# Patient Record
Sex: Male | Born: 1995 | Race: White | Hispanic: No | Marital: Single | Smoking: Current every day smoker
Health system: Southern US, Community
[De-identification: ages and names within clinical notes are randomized; demographics above are authoritative.]

## PROBLEM LIST (undated history)

## (undated) DIAGNOSIS — F319 Bipolar disorder, unspecified: Secondary | ICD-10-CM

## (undated) HISTORY — DX: Bipolar disorder, unspecified: F31.9

---
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
## 2014-07-06 ENCOUNTER — Encounter: Payer: Self-pay | Admitting: Physician Assistant

## 2014-07-06 ENCOUNTER — Ambulatory Visit (INDEPENDENT_AMBULATORY_CARE_PROVIDER_SITE_OTHER): Payer: BLUE CROSS/BLUE SHIELD | Admitting: Physician Assistant

## 2014-07-06 VITALS — BP 100/58 | HR 66 | Temp 97.7°F | Resp 16 | Ht 75.5 in | Wt 163.2 lb

## 2014-07-06 DIAGNOSIS — F1210 Cannabis abuse, uncomplicated: Secondary | ICD-10-CM

## 2014-07-06 DIAGNOSIS — F3160 Bipolar disorder, current episode mixed, unspecified: Secondary | ICD-10-CM

## 2014-07-06 DIAGNOSIS — Z113 Encounter for screening for infections with a predominantly sexual mode of transmission: Secondary | ICD-10-CM | POA: Diagnosis not present

## 2014-07-06 DIAGNOSIS — Z0000 Encounter for general adult medical examination without abnormal findings: Secondary | ICD-10-CM | POA: Diagnosis not present

## 2014-07-06 DIAGNOSIS — F319 Bipolar disorder, unspecified: Secondary | ICD-10-CM | POA: Insufficient documentation

## 2014-07-06 NOTE — Patient Instructions (Signed)
Start a Clinical research associatecreative project. Do not smoke before 4 pm. Skateboard or do some type of exercise every day. Give latuda some time to get in your system - follow up with psychiatrist and outpatient intensive program. Anytime you need to talk or if you have any concerns, come see me. Practice safe sex always. Check with your parents to make sure you got the gardasil series. I will call you with the results of your lab tests.

## 2014-07-06 NOTE — Progress Notes (Signed)
Subjective:    Patient ID: Mason Owens, male    DOB: 1995/09/03, 19 y.o.   MRN: [id number]  HPI  This is a 19 year old male presenting for CPE.  Patient currently works at [employer] and lives at home with his parents. Dropped out of [employer] after her freshman year. 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 States he was doing too many drugs and decided that does not go away he wanted to live his life. In addition he has a long history of bipolar disorder and borderline personality disorder and the drugs were making him more depressed. States he continues to smoke marijuana 4-5 times a day. He drinks alcohol socially a few times a week. States he will drink a 40 ounce beer when he goes out. He states he knows that substances make him more depressed but when he is depressed all he wants to smoke marijuana. Sees a psychiatrist, Dr. Haywood Lassoaudle, for medication management. 2 weeks ago he was put on latuda. Pt states he cannot tell a difference yet. He is going back to see him in 1 week. Going to intensive outpatient therapy at [facility] this summer. Never been hospitalized before. Last manic episode 1 week ago and lasted 2 days. He has a history of 1 suicide attempt when he was in 6th grade. No attempts since. He states "I'm too big of a wimp". He states he has SI all the time but never has plans to hurt himself or others. He has a 414 year old brother who he has a very good relationship with. He states "I could never leave him". Pt is interested in music, sketching, writing and skateboarding. Says it is very hard for him to engage in the activities because he is so depressed even though when he makes himself do them he feels better.   He is sexually active with male partners - 10 partners in past 1 year. When he is manic he does not use good judgment in using protection. He has never been tested for STDs.  Up to date on immunizations. States he got the  gardasil series as a child  Fam hx: PGM breast cancer. Dad depression. No history of DM, HLD, HTN although he states "my dad has a lot of problems, I don't really know what they are".  Review of Systems  Constitutional: Negative.   HENT: Negative.   Eyes: Negative.   Respiratory: Negative.   Cardiovascular: Negative.   Gastrointestinal: Negative.   Endocrine: Negative.   Genitourinary: Negative.   Musculoskeletal: Negative.   Skin: Negative.   Allergic/Immunologic: Negative.   Neurological: Negative.   Hematological: Negative.   Psychiatric/Behavioral: Positive for suicidal ideas, dysphoric mood and decreased concentration. Negative for self-injury. The patient is not nervous/anxious.     There are no active problems to display for this patient.  Prior to Admission medications   Medication Sig Start Date End Date Taking? Authorizing Provider  divalproex (DEPAKOTE) 500 MG DR tablet Take 500 mg by mouth daily.   Yes Historical Provider, MD  Lurasidone HCl 60 MG TABS Take by mouth daily.   Yes Historical Provider, MD  Omega-3 Fatty Acids (FISH OIL PO) Take 2,600 mg by mouth daily.   Yes Historical Provider, MD   No Known Allergies  Patient's social and family history were reviewed.     Objective:   Physical Exam  Constitutional: He is oriented to person, place, and time. He  appears well-developed and well-nourished. No distress.  HENT:  Head: Normocephalic and atraumatic.  Right Ear: Hearing, tympanic membrane, external ear and ear canal normal.  Left Ear: Hearing, tympanic membrane, external ear and ear canal normal.  Nose: Nose normal.  Mouth/Throat: Uvula is midline, oropharynx is clear and moist and mucous membranes are normal.  Eyes: Conjunctivae, EOM and lids are normal. Pupils are equal, round, and reactive to light. Right eye exhibits no discharge. Left eye exhibits no discharge. No scleral icterus.  Neck: Trachea normal and normal range of motion. Neck supple.  Carotid bruit is not present. No thyromegaly present.  Cardiovascular: Normal rate, regular rhythm, normal heart sounds, intact distal pulses and normal pulses.   No murmur heard. Pulmonary/Chest: Effort normal and breath sounds normal. No respiratory distress. He has no wheezes. He has no rhonchi. He has no rales.  Abdominal: Soft. Normal appearance and bowel sounds are normal. He exhibits no abdominal bruit. There is no tenderness.  Musculoskeletal: Normal range of motion. He exhibits no edema or tenderness.  Lymphadenopathy:       Head (right side): No submental, no submandibular, no tonsillar, no preauricular, no posterior auricular and no occipital adenopathy present.       Head (left side): No submental, no submandibular, no tonsillar, no preauricular, no posterior auricular and no occipital adenopathy present.    He has no cervical adenopathy.  Neurological: He is alert and oriented to person, place, and time. He has normal strength and normal reflexes. No cranial nerve deficit or sensory deficit. Coordination and gait normal.  Skin: Skin is warm, dry and intact. No lesion and no rash noted.  Psychiatric: He has a normal mood and affect. His speech is normal and behavior is normal. Judgment and thought content normal.   BP 100/58 mmHg  Pulse 66  Temp(Src) 97.7 F (36.5 C) (Oral)  Resp 16  Ht 6' 3.5" (1.918 m)  Wt 163 lb 3.2 oz (74.027 kg)  BMI 20.12 kg/m2  SpO2 100%     Assessment & Plan:  1. Annual physical exam 2. Bipolar disorder 3. Screen for STD 4. Marijuana abuse Bipolar disorder not well controlled. He will followup with his psychiatrist and will be participating in an outpatient intensive behavioral health program in the next few weeks. Hopefully he will benefit from this. Advised he not smoke marijuana before 4 PM any day. Advised he start a creative project and start skateboarding more often as he feels better when he does this. STD screening today due to high risk  sexual behavior. Discussed safe practices. Labs blood pending. He will return with any problems or concerns. - CBC  - Vitamin D 1,25 dihydroxy - TSH - Comprehensive metabolic panel - GC/Chlamydia Probe Amp - Hepatitis B surface antigen - Hepatitis C antibody - HIV antibody - RPR   Roswell Miners. Dyke Brackett, MHS [facility]  07/06/2014

## 2014-07-07 LAB — GC/CHLAMYDIA PROBE AMP
CT PROBE, AMP APTIMA: NEGATIVE
GC PROBE AMP APTIMA: NEGATIVE

## 2014-07-07 LAB — CBC
HEMATOCRIT: 44.2 % (ref 39.0–52.0)
Hemoglobin: 14.7 g/dL (ref 13.0–17.0)
MCH: 30.3 pg (ref 26.0–34.0)
MCHC: 33.3 g/dL (ref 30.0–36.0)
MCV: 91.1 fL (ref 78.0–100.0)
MPV: 11.7 fL (ref 8.6–12.4)
Platelets: 173 10*3/uL (ref 150–400)
RBC: 4.85 MIL/uL (ref 4.22–5.81)
RDW: 14.3 % (ref 11.5–15.5)
WBC: 4.4 10*3/uL (ref 4.0–10.5)

## 2014-07-07 LAB — COMPREHENSIVE METABOLIC PANEL
ALK PHOS: 44 U/L (ref 39–117)
ALT: 8 U/L (ref 0–53)
AST: 18 U/L (ref 0–37)
Albumin: 4.7 g/dL (ref 3.5–5.2)
BILIRUBIN TOTAL: 0.5 mg/dL (ref 0.2–1.2)
BUN: 12 mg/dL (ref 6–23)
CALCIUM: 9.4 mg/dL (ref 8.4–10.5)
CHLORIDE: 104 meq/L (ref 96–112)
CO2: 27 mEq/L (ref 19–32)
CREATININE: 0.7 mg/dL (ref 0.50–1.35)
GLUCOSE: 81 mg/dL (ref 70–99)
Potassium: 4.2 mEq/L (ref 3.5–5.3)
SODIUM: 141 meq/L (ref 135–145)
Total Protein: 7.2 g/dL (ref 6.0–8.3)

## 2014-07-07 LAB — RPR

## 2014-07-07 LAB — HIV ANTIBODY (ROUTINE TESTING W REFLEX): HIV 1&2 Ab, 4th Generation: NONREACTIVE

## 2014-07-07 LAB — HEPATITIS C ANTIBODY: HCV AB: NEGATIVE

## 2014-07-07 LAB — HEPATITIS B SURFACE ANTIGEN: Hepatitis B Surface Ag: NEGATIVE

## 2014-07-07 LAB — TSH: TSH: 0.745 u[IU]/mL (ref 0.350–4.500)

## 2014-07-09 LAB — VITAMIN D 1,25 DIHYDROXY
VITAMIN D3 1, 25 (OH): 33 pg/mL
Vitamin D 1, 25 (OH)2 Total: 33 pg/mL (ref 18–72)

## 2014-07-12 ENCOUNTER — Other Ambulatory Visit (HOSPITAL_COMMUNITY): Payer: BLUE CROSS/BLUE SHIELD | Attending: Psychiatry | Admitting: Psychiatry

## 2014-07-12 ENCOUNTER — Encounter (HOSPITAL_COMMUNITY): Payer: Self-pay

## 2014-07-12 DIAGNOSIS — F3110 Bipolar disorder, current episode manic without psychotic features, unspecified: Secondary | ICD-10-CM

## 2014-07-12 DIAGNOSIS — F331 Major depressive disorder, recurrent, moderate: Secondary | ICD-10-CM | POA: Diagnosis present

## 2014-07-12 DIAGNOSIS — F900 Attention-deficit hyperactivity disorder, predominantly inattentive type: Secondary | ICD-10-CM | POA: Diagnosis not present

## 2014-07-12 NOTE — Progress Notes (Signed)
    Daily Group Progress Note  Program: IOP  Group Time:  9:00-10:30  Participation Level: Active  Behavioral Response: Appropriate  Type of Therapy:  Group Therapy  Summary of Progress: Pt. Met with case Freight forwarder.      Group Time: 10:30-12:00  Participation Level:  Active  Behavioral Response: Appropriate  Type of Therapy: Psycho-education Group  Summary of Progress: Pt. Participated in discussion about countering perfectionism. Pt. Reported that he currently is challenged by finding the motivation for getting out of bed and basic self-care.   Nancie Neas, LPC

## 2014-07-12 NOTE — Progress Notes (Signed)
Mason Owens is a 19 y.o., single, employed, Caucasian male; who was referred per therapist of three months ([facility], [facility]); treatment for Bipolar Disorder.  Pt is a patient of Dr. Neoma Lamingottles'.  Been with him since January 2016.  Hx of ADHD.  States he was abusing and selling the medication; so he has been off of it for awhile.  All pt's symptoms are clouded by daily active drug use (THC).  C/O increased appetite, sadness, anhedonia, low energy, no motivation, irritability, poor concentration, tearfulness, and SI.  Denies a plan or intent.  Discussed safety options at length.  Pt able to contract for safety.  Denies HI or A/V hallucinations.  According to pt, he [place] attending college [place], [place], but dropped out due to increased drug use.  Apparently his relationship with his girlfriend ended also during the Spring of 2015.  Pt moved back home with his parents and has been there since then.  Pt is planning to attend [employer] this year.  Pt currently works at [employer].  "I go to work stoned everyday.  I use because there is nothing to do here in [place].  It is boring here."   Denies any previous psychiatric hospitalizations or suicide attempts.  Family Hx:  Maternal Grandmother (Depression and Anxiety); Father (Depression, ADD, hx of ETOH and drug use); Brother (Anxiety). Childhood:  Born [place]  Reports "pretty good" childhood.  Denies trauma or abuse.  Reports having a difficult time in school.  "It was horrible.  I was a smart kid, but not book smart.  I struggled in [employer]."  Diagnosed with ADHD in the third grade.  Started using drugs in high school. Sibling:  21 yr old brother within the home. Drugs/ETOH:  Admits to daily THC use.  Uses 1-2 grams several times a day.  Been using THC for five years at this pace.  Hx of using acid, mushrooms, cocaine, benzo's, amphetamines and ETOH.  Currently denies any drugs except for THC. Discussed abstinence with pt at length.  Discussed a  referral to CD-IOP; but pt declined.  Pt isn't willing to stop use of THC; but is willing not to use before attending MH-IOP.  Denies any current legal issues.  States he was charged years ago for riding with THC in his car; but it was expunged.   Pt reports his parents are his support system. Pt completed all forms.  Pt apparently wasn't given a Burns; will provide pt with one tomorrow.  A:  Oriented pt.  Provided pt with an orientation folder.  Informed Weston SettleMelissa Owens, LPC and Dr. Jennelle Humanottle of admit.  Informed pt that if it seems as though this level of care is incorrect for him or isn't beneficial, he would be discharged to another level of care.  Also, mentioned he would be discharged on the spot if he attends under the influence.  Encouraged support groups.  R:  Pt receptive.

## 2014-07-12 NOTE — Addendum Note (Signed)
Addended by: Sheralyn BoatmanLARK, Lauris Keepers P on: 07/12/2014 04:29 PM   Modules accepted: Medications

## 2014-07-13 ENCOUNTER — Encounter (HOSPITAL_COMMUNITY): Payer: Self-pay | Admitting: Psychiatry

## 2014-07-13 ENCOUNTER — Other Ambulatory Visit (HOSPITAL_COMMUNITY): Payer: BLUE CROSS/BLUE SHIELD | Admitting: Psychiatry

## 2014-07-13 DIAGNOSIS — F331 Major depressive disorder, recurrent, moderate: Secondary | ICD-10-CM | POA: Diagnosis not present

## 2014-07-13 NOTE — Progress Notes (Signed)
Psychiatric Assessment Adult  Patient Identification:  Mason Owens Date of Evaluation:  07/13/2014 Chief Complaint: depression with some suicidal ideation but no intent and inability to stop using marijuana History of Chief Complaint:  Mr Mason Owens says he has been depressed for at least a couple of years.  He has some suicidal thoughts but no intent.  He smokes marijuana daily and wants to stop but cannot.  That is what is depressing him he says because he has no life and no future unless he stops.  He was in college but spent his time using mainly marijuana but trying every drug he could.  He dropped out because he was getting nowhere.  He broke up with his girlfriend at the same time.  He now lives with his parents and they put up with his smoking cigarettes and pot even though they disapprove, he says.  He was diagnosed as a child and took stimulants which seemed to help but did not take meds in college.  He still has trouble concentrating, following through with tasks and promises and responsibilities, impulsivity, planning, feeling bored all the time, being easily distracted.  Consequently he underachieves, cannot focus on doing anything that requires effort, feels depressed, guilty, bored, has some thoughts that it is never going to get any better and then has suicidal thoughts but does not want to die.  He does work at [employer] and likes it there.  He has been diagnosed with bipolar disorder but it is hard to distinguish mania from the racing thoughts, impulsivity, trouble sleeping associated with his ADHD because there are no clear cut manic episodes discrete from his usual self. He does want to change.  HPI Review of Systems Physical Exam  Depressive Symptoms: depressed mood, insomnia, feelings of worthlessness/guilt, difficulty concentrating, suicidal thoughts without plan,  (Hypo) Manic Symptoms:   Elevated Mood:  Negative Irritable Mood:  Yes Grandiosity:   negative Distractibility:  Yes Labiality of Mood:  Yes Delusions:  Negative Hallucinations:  Negative Impulsivity:  Yes Sexually Inappropriate Behavior:  Negative Financial Extravagance:  Negative Flight of Ideas:  Negative  Anxiety Symptoms: Excessive Worry:  Negative Panic Symptoms:  Negative Agoraphobia:  Negative Obsessive Compulsive: Negative  Symptoms: None, Specific Phobias:  Negative Social Anxiety:  Negative  Psychotic Symptoms:  Hallucinations: Negative None Delusions:  Negative Paranoia:  Negative   Ideas of Reference:  Negative  PTSD Symptoms: Ever had a traumatic exposure:  Negative Had a traumatic exposure in the last month:  Negative Re-experiencing: Negative None Hypervigilance:  Negative Hyperarousal: Negative None Avoidance: Negative None  Traumatic Brain Injury: Negative na  Past Psychiatric History: Diagnosis: bipolar disorder I,mixed. ADHD inattentive type.  Cannabinoid use disordeer  Hospitalizations: none  Outpatient Care: sees psychiatrist and therapist  Substance Abuse Care: sees therapist at [facility]  Self-Mutilation: none  Suicidal Attempts: none  Violent Behaviors: none   Past Medical History:   Past Medical History  Diagnosis Date  . Bipolar disorder    History of Loss of Consciousness:  Negative Seizure History:  Negative Cardiac History:  Negative Allergies:  No Known Allergies Current Medications:  Current Outpatient Prescriptions  Medication Sig Dispense Refill  . divalproex (DEPAKOTE) 500 MG DR tablet Take 500 mg by mouth daily. Take 1250 mg daily    . Lurasidone HCl 60 MG TABS Take by mouth daily.    . Omega-3 Fatty Acids (FISH OIL PO) Take 2,600 mg by mouth daily.     No current facility-administered medications for this  visit.    Previous Psychotropic Medications:  Medication Dose   see list above  na                     Substance Abuse History in the last 12 months:uses marijuana and cigarettes daily  and has since he was in his mid teens.  Has tried alcohol, hallucinogens, benzodiazepines but no longer uses any of these substances                                                                                                   Medical Consequences of Substance Abuse: none  Legal Consequences of Substance Abuse: none  Family Consequences of Substance Abuse: dropped out of school, has to live with parents,  Lost his girlfriend  Blackouts:  Negative DT's:  Negative Withdrawal Symptoms:  Negative None  Social History: Current Place of Residence: [place] Place of Birth: [place] Family Members: parents and brother Marital Status:  Single Children: 0  Sons: 0  Daughters: 0 Relationships: friends Education:  one year of college and will be at [employer] this summer. Educational Problems/Performance: poor  Unmotivated student though intelligent Religious Beliefs/Practices: Ephriam KnucklesChristian but not practicing History of Abuse: none Occupational Experiences; Military History:  None. Legal History: none Hobbies/Interests: skateboarding, music  Family History:   Family History  Problem Relation Age of Onset  . ADD / ADHD Father   . Depression Father   . Anxiety disorder Brother   . Depression Maternal Grandmother   . Anxiety disorder Maternal Grandmother     Mental Status Examination/Evaluation: Objective:  Appearance: Well Groomed  Eye Contact::  Good  Speech:  Clear and Coherent  Volume:  Normal  Mood:  depressed  Affect:  Congruent  Thought Process:  Coherent and Logical  Orientation:  Full (Time, Place, and Person)  Thought Content:  Negative  Suicidal Thoughts:  Yes.  without intent/plan  Homicidal Thoughts:  No  Judgement:  Intact  Insight:  Good  Psychomotor Activity:  Normal  Akathisia:  Negative  Handed:  Right  AIMS (if indicated):  0  Assets:  Communication Skills Desire for Improvement Financial Resources/Insurance Housing Leisure Time Physical  Health Resilience Social Support Talents/Skills Transportation Vocational/Educational    Laboratory/X-Ray Psychological Evaluation(s)   na  na   Assessment:  Major depression, recurrent moderate.  ADHD, inattentive type.  Cannabinoid use disorder                  Treatment Plan/Recommendations:  Plan of Care: daily group therapy  Laboratory:  na  Psychotherapy: group therapy  Medications: continue current meds, consider stimulant under parental supervision if Dr Jennelle Humanottle agrees with the plan  Routine PRN Medications:  Yes  Consultations: Dr Jennelle Humanottle regarding medications  Safety Concerns:  none  Other:      Benjaman PottAYLOR,GERALD D, MD 5/10/20161:12 PM

## 2014-07-13 NOTE — Progress Notes (Signed)
    Daily Group Progress Note  Program: IOP  Group Time: 1100-1200  Participation Level: Active  Behavioral Response: Appropriate and Sharing  Type of Therapy:  Psycho-education Group  Summary of Progress: Patient participated in various movement exercises. Discussed ten relaxation techniques that will help with stress. Also, listened to a CD and applied focused breathing and progressive relaxation.                 

## 2014-07-14 ENCOUNTER — Telehealth (HOSPITAL_COMMUNITY): Payer: Self-pay | Admitting: Psychiatry

## 2014-07-14 ENCOUNTER — Other Ambulatory Visit (HOSPITAL_COMMUNITY): Payer: BLUE CROSS/BLUE SHIELD

## 2014-07-15 ENCOUNTER — Other Ambulatory Visit (HOSPITAL_COMMUNITY): Payer: BLUE CROSS/BLUE SHIELD | Admitting: Psychiatry

## 2014-07-15 DIAGNOSIS — F331 Major depressive disorder, recurrent, moderate: Secondary | ICD-10-CM | POA: Diagnosis not present

## 2014-07-16 ENCOUNTER — Other Ambulatory Visit (HOSPITAL_COMMUNITY): Payer: BLUE CROSS/BLUE SHIELD

## 2014-07-16 NOTE — Progress Notes (Signed)
    Daily Group Progress Note  Program: IOP  Group Time: 9:00-10:30  Participation Level: Active  Behavioral Response: Appropriate  Type of Therapy:  Group Therapy  Summary of Progress: Pt. Reported that he was tired and has not been sleeping well last few night. Pt. Discussed pattern of working until 10:00pm and hanging out and smoking until 3:00am. Pt. Expressed awareness of need for meaning and purpose in his life and that current lifestyle is not sustainable and conducive to his long-term health.      Group Time: 10:30-12:00  Participation Level:  Active  Behavioral Response: Appropriate  Type of Therapy: Psycho-education Group  Summary of Progress: Pt. Participated in discussion about development of self-compassion. Pt. Was attentive during discussion of Clovia CuffKristin Neff video about development of self-compassion through mindfulness, kindness to self, and recognition of common humanity.   Shaune PollackBrown, Jennifer B, LPC

## 2014-07-19 ENCOUNTER — Other Ambulatory Visit (HOSPITAL_COMMUNITY): Payer: BLUE CROSS/BLUE SHIELD | Admitting: Psychiatry

## 2014-07-20 ENCOUNTER — Other Ambulatory Visit (HOSPITAL_COMMUNITY): Payer: BLUE CROSS/BLUE SHIELD | Admitting: Psychiatry

## 2014-07-20 DIAGNOSIS — F331 Major depressive disorder, recurrent, moderate: Secondary | ICD-10-CM | POA: Diagnosis not present

## 2014-07-20 NOTE — Progress Notes (Signed)
    Daily Group Progress Note  Program: IOP  Group Time: 0900-1045  Participation Level: Active  Behavioral Response: Appropriate  Type of Therapy:  Group Therapy  Summary of Progress: Pt states he didn't attend MH-IOP yesterday due to attending a concert Sunday night and he got in very late.  Pt states the new medication is helping him to focus better.  Pt voiced that he has decided to attend [employer].  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

## 2014-07-21 ENCOUNTER — Other Ambulatory Visit (HOSPITAL_COMMUNITY): Payer: BLUE CROSS/BLUE SHIELD

## 2014-07-22 ENCOUNTER — Other Ambulatory Visit (HOSPITAL_COMMUNITY): Payer: BLUE CROSS/BLUE SHIELD

## 2014-07-23 ENCOUNTER — Other Ambulatory Visit (HOSPITAL_COMMUNITY): Payer: BLUE CROSS/BLUE SHIELD

## 2014-07-23 ENCOUNTER — Ambulatory Visit (HOSPITAL_COMMUNITY): Payer: BLUE CROSS/BLUE SHIELD | Admitting: Psychiatry

## 2014-07-23 DIAGNOSIS — F331 Major depressive disorder, recurrent, moderate: Secondary | ICD-10-CM | POA: Diagnosis not present

## 2014-07-26 ENCOUNTER — Other Ambulatory Visit (HOSPITAL_COMMUNITY): Payer: BLUE CROSS/BLUE SHIELD

## 2014-07-26 NOTE — Psych (Signed)
    Daily Group Progress Note  Program: IOP  Group Time: 9:00-10:30  Participation Level: Active  Behavioral Response: Appropriate  Type of Therapy:  Psycho-education Group  Summary of Progress: Pt. Watched and discussed Dewain PenningBrene Brown video on developing vulnerability for shame resilience.      Group Time: 10:30-12:00  Participation Level:  Active  Behavioral Response: Appropriate  Type of Therapy: Group Therapy  Summary of Progress: Pt. Presented as calm, talkative, supportive of other group members. Pt. Shared history of using marijuana to numb feelings and anxiety. Pt. Discussed relationships with parents who are generally supportive, and mother who is especially concerned about marijuana use and loss of motivation.   Shaune PollackBrown, Jennifer B, LPC

## 2014-07-27 ENCOUNTER — Other Ambulatory Visit (HOSPITAL_COMMUNITY): Payer: BLUE CROSS/BLUE SHIELD

## 2014-07-27 ENCOUNTER — Ambulatory Visit (HOSPITAL_COMMUNITY): Payer: Self-pay

## 2014-07-28 ENCOUNTER — Other Ambulatory Visit (HOSPITAL_COMMUNITY): Payer: BLUE CROSS/BLUE SHIELD

## 2014-07-28 ENCOUNTER — Ambulatory Visit (HOSPITAL_COMMUNITY): Payer: Self-pay

## 2014-07-29 ENCOUNTER — Other Ambulatory Visit (HOSPITAL_COMMUNITY): Payer: BLUE CROSS/BLUE SHIELD

## 2014-07-29 ENCOUNTER — Other Ambulatory Visit (HOSPITAL_COMMUNITY): Payer: BLUE CROSS/BLUE SHIELD | Admitting: Psychiatry

## 2014-07-29 DIAGNOSIS — F331 Major depressive disorder, recurrent, moderate: Secondary | ICD-10-CM

## 2014-07-29 DIAGNOSIS — F900 Attention-deficit hyperactivity disorder, predominantly inattentive type: Secondary | ICD-10-CM | POA: Insufficient documentation

## 2014-07-29 NOTE — Progress Notes (Signed)
Mason Owens is a 19 y.o. ,single, employed, Caucasian male; who was referred per therapist of three months ([facility], [facility]); treatment for Bipolar Disorder. Pt is a patient of Dr. Neoma Lamingottles'. Been with him since January 2016. Hx of ADHD. Stated he was abusing and selling the medication; so he has been off of it for awhile. All pt's symptoms are clouded by daily active drug use (THC). C/O increased appetite, sadness, anhedonia, low energy, no motivation, irritability, poor concentration, tearfulness, and SI. Denied a plan or intent. Discussed safety options at length. Pt was able to contract for safety. Denied HI or A/V hallucinations. According to pt, he [place] attending college in [place], [place], but dropped out due to increased drug use. Apparently his relationship with his girlfriend ended also during the Spring of 2015. Pt moved back home with his parents and has been there since then. Pt has enrolled into [employer] and has started his classes. Pt currently works at [employer]. "I go to work stoned everyday. I use because there is nothing to do here in [place]. It is boring here."  Denied any previous psychiatric hospitalizations or suicide attempts. Family Hx: Maternal Grandmother (Depression and Anxiety); Father (Depression, ADD, hx of ETOH and drug use); Brother (Anxiety). Pt requesting discharge from MH-IOP today.  States he is in a good place now.  "I don't feel as down like I did before I came in."  Pt admits to continued use of THC.  Denied using this morning before attending MH-IOP.  States he would like to stop, but feels that he can't.  Denies SI/HI or A/V hallucinations.  A:  D/C today.  F/U with Dr. Jennelle Humanottle ~ 08-14-14 @ 3 pm and encouraged pt to schedule an appointment with Weston SettleMelissa Owens, LPC soon.  Encouraged support groups and AA/NA meetings.  R:  Pt receptive.

## 2014-07-29 NOTE — Patient Instructions (Signed)
Patient is requesting discharge from MH-IOP.  F/U with Dr. Jennelle Humanottle on 08-14-14 @ 3 pm.  Patient will schedule an appointment with Weston SettleMelissa Mekita, LPC.  Encouraged support groups.

## 2014-07-29 NOTE — Progress Notes (Unsigned)
[facility]  Mason BundeHudson R Owens [id number] 18 y.o.  07/29/2014 11:30 AM   Start: 07/12/2014 End: 07/29/2014 Reason for admission- depression with hopelessness. Referred by his therapist.   IOP experience: Today states IOP was helpful. He missed several classes due to his hectic work schedule and sleeping in late in the morning. Despite missing several session he states he is in a good place now. IOP gave him hope and he has learned some coping skills. Depression comes in waves and today he is fine.   Denies manic and hypomanic symptoms including periods of decreased need for sleep, increased energy, mood lability, impulsivity, FOI, and excessive spending.  Pt is smoking THC several times a day.   See's Dr. Marc Morgansoddle. Pt is taking Depakote, Latuda and Nuvigil. States meds are helping and reports SE of sleepiness.  Suicidal Ideation: No Plan Formed: No Patient has means to carry out plan: No  Homicidal Ideation: No Plan Formed: No Patient has means to carry out plan: No  Review of Systems: Psychiatric: Agitation: Yes Hallucination: No Depressed Mood: Yes Insomnia: No Hypersomnia: No Altered Concentration: Yes Feels Worthless: No Grandiose Ideas: No Belief In Special Powers: No New/Increased Substance Abuse: Yes Compulsions: No  Review of Systems  Constitutional: Negative for fever, chills and weight loss.  HENT: Negative for congestion, ear pain, nosebleeds and sore throat.   Eyes: Negative for blurred vision, double vision and pain.  Respiratory: Negative for cough, shortness of breath and wheezing.   Cardiovascular: Negative for chest pain, palpitations and leg swelling.  Gastrointestinal: Negative for heartburn, nausea, vomiting and abdominal pain.  Musculoskeletal: Positive for back pain and neck pain. Negative for joint pain.  Skin: Negative for itching and rash.  Neurological: Negative for dizziness, sensory change, seizures, loss of  consciousness and headaches.  Psychiatric/Behavioral: Positive for depression. Negative for suicidal ideas, hallucinations and substance abuse. The patient is not nervous/anxious and does not have insomnia.      Past Medical Family, Social History: living with parents. Works at [employer] and [employer].   Outpatient Encounter Prescriptions as of 07/28/2014  Medication Sig  . divalproex (DEPAKOTE) 500 MG DR tablet Take 500 mg by mouth daily. Take 1250 mg daily  . Lurasidone HCl 60 MG TABS Take by mouth daily.  . Omega-3 Fatty Acids (FISH OIL PO) Take 2,600 mg by mouth daily.   No facility-administered encounter medications on file as of 07/28/2014.    Physical Exam: Constitutional:  There were no vitals taken for this visit.  General Appearance: alert, oriented, no acute distress  Musculoskeletal: Strength & Muscle Tone: within normal limits Gait & Station: normal Patient leans: N/A  Mental Status Examination/Evaluation: Objective: Attitude: Calm and cooperative  Appearance: Casual, appears to be stated age  Eye Contact::  Good  Speech:  Clear and Coherent and Normal Rate  Volume:  Normal  Mood:  euthymic  Affect:  Full Range  Thought Process:  Goal Directed  Orientation:  Full (Time, Place, and Person)  Thought Content:  Negative  Suicidal Thoughts:  No  Homicidal Thoughts:  No  Judgement:  Fair  Insight:  Shallow  Concentration: good  Memory: Immediate-good Recent-good Remote-good  Recall: fair  Language: fair  Gait and Station: normal  [employer] of Knowledge: average  Psychomotor Activity:  Normal  Akathisia:  No  Handed:  Right  AIMS (if indicated):  n/a    Assets:  Communication Skills Desire for Improvement    Level of Care:  IOP  Discharge destination:  Home  Is patient on multiple antipsychotic therapies at discharge:  No    Has Patient had three or more failed trials of antipsychotic monotherapy by history:  No  Follow-up  recommendations:  Activity:  As tolerated Diet:  Normal diet  Assessment: Axis I: Major depression, 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 Cannabinoid use disorder   Plan: d/c from IOP Pt will f/up with Dr. Marc Morgans  No refills today   [facility] 07/29/2014 Oletta Darter, M.D.

## 2014-07-29 NOTE — Progress Notes (Signed)
    Daily Group Progress Note  Program: IOP  Group Time: 9:00-10:30  Participation Level: Active  Behavioral Response: Appropriate  Type of Therapy:  Group Therapy  Summary of Progress: Pt. Prepared for discharge today. Pt. Discussed desire to find a new job, needs for career and personal exploration to discover what he wants to do. Pt. Expressed concern about daily marijuana use, financial consequences of use, consequences with family especially his mother who is very disapproving of his use, and loss of motivation.     Group Time: 10:30-12:00  Participation Level:  Active  Behavioral Response: Appropriate  Type of Therapy: Psycho-education Group  Summary of Progress: Pt. Participated in discussion about setting healthy boundaries in professional and personal relationships.    Shaune PollackBrown, Stanley Lyness B, LPC

## 2014-07-30 ENCOUNTER — Other Ambulatory Visit (HOSPITAL_COMMUNITY): Payer: BLUE CROSS/BLUE SHIELD

## 2014-07-30 ENCOUNTER — Ambulatory Visit (HOSPITAL_COMMUNITY): Payer: Self-pay

## 2014-08-03 ENCOUNTER — Ambulatory Visit (HOSPITAL_COMMUNITY): Payer: Self-pay

## 2014-08-03 ENCOUNTER — Other Ambulatory Visit (HOSPITAL_COMMUNITY): Payer: BLUE CROSS/BLUE SHIELD

## 2014-08-04 ENCOUNTER — Ambulatory Visit (HOSPITAL_COMMUNITY): Payer: Self-pay

## 2014-08-04 ENCOUNTER — Other Ambulatory Visit (HOSPITAL_COMMUNITY): Payer: BLUE CROSS/BLUE SHIELD

## 2014-08-05 ENCOUNTER — Other Ambulatory Visit (HOSPITAL_COMMUNITY): Payer: BLUE CROSS/BLUE SHIELD

## 2014-08-06 ENCOUNTER — Other Ambulatory Visit (HOSPITAL_COMMUNITY): Payer: BLUE CROSS/BLUE SHIELD

## 2014-08-09 ENCOUNTER — Other Ambulatory Visit (HOSPITAL_COMMUNITY): Payer: BLUE CROSS/BLUE SHIELD

## 2014-08-10 ENCOUNTER — Other Ambulatory Visit (HOSPITAL_COMMUNITY): Payer: BLUE CROSS/BLUE SHIELD

## 2014-08-11 ENCOUNTER — Other Ambulatory Visit (HOSPITAL_COMMUNITY): Payer: BLUE CROSS/BLUE SHIELD

## 2014-08-12 ENCOUNTER — Other Ambulatory Visit (HOSPITAL_COMMUNITY): Payer: BLUE CROSS/BLUE SHIELD

## 2014-08-13 ENCOUNTER — Other Ambulatory Visit (HOSPITAL_COMMUNITY): Payer: BLUE CROSS/BLUE SHIELD

## 2014-08-16 ENCOUNTER — Other Ambulatory Visit (HOSPITAL_COMMUNITY): Payer: BLUE CROSS/BLUE SHIELD

## 2014-08-17 ENCOUNTER — Other Ambulatory Visit (HOSPITAL_COMMUNITY): Payer: BLUE CROSS/BLUE SHIELD

## 2014-08-18 ENCOUNTER — Other Ambulatory Visit (HOSPITAL_COMMUNITY): Payer: BLUE CROSS/BLUE SHIELD

## 2014-08-19 ENCOUNTER — Other Ambulatory Visit (HOSPITAL_COMMUNITY): Payer: BLUE CROSS/BLUE SHIELD

## 2014-08-20 ENCOUNTER — Other Ambulatory Visit (HOSPITAL_COMMUNITY): Payer: BLUE CROSS/BLUE SHIELD

## 2014-08-23 ENCOUNTER — Other Ambulatory Visit (HOSPITAL_COMMUNITY): Payer: BLUE CROSS/BLUE SHIELD

## 2014-08-24 ENCOUNTER — Other Ambulatory Visit (HOSPITAL_COMMUNITY): Payer: BLUE CROSS/BLUE SHIELD

## 2014-08-25 ENCOUNTER — Other Ambulatory Visit (HOSPITAL_COMMUNITY): Payer: BLUE CROSS/BLUE SHIELD

## 2014-08-26 ENCOUNTER — Other Ambulatory Visit (HOSPITAL_COMMUNITY): Payer: BLUE CROSS/BLUE SHIELD

## 2014-08-27 ENCOUNTER — Other Ambulatory Visit (HOSPITAL_COMMUNITY): Payer: BLUE CROSS/BLUE SHIELD

## 2014-08-30 ENCOUNTER — Other Ambulatory Visit (HOSPITAL_COMMUNITY): Payer: BLUE CROSS/BLUE SHIELD

## 2014-09-28 ENCOUNTER — Ambulatory Visit: Payer: BLUE CROSS/BLUE SHIELD | Admitting: Physician Assistant

## 2014-11-16 ENCOUNTER — Ambulatory Visit (INDEPENDENT_AMBULATORY_CARE_PROVIDER_SITE_OTHER): Payer: BLUE CROSS/BLUE SHIELD | Admitting: Physician Assistant

## 2014-11-16 ENCOUNTER — Encounter: Payer: Self-pay | Admitting: Physician Assistant

## 2014-11-16 VITALS — BP 107/65 | HR 62 | Temp 98.6°F | Resp 16 | Ht 76.0 in | Wt 160.6 lb

## 2014-11-16 DIAGNOSIS — F1210 Cannabis abuse, uncomplicated: Secondary | ICD-10-CM

## 2014-11-16 DIAGNOSIS — F3160 Bipolar disorder, current episode mixed, unspecified: Secondary | ICD-10-CM | POA: Diagnosis not present

## 2014-11-16 DIAGNOSIS — J209 Acute bronchitis, unspecified: Secondary | ICD-10-CM | POA: Diagnosis not present

## 2014-11-16 DIAGNOSIS — Z720 Tobacco use: Secondary | ICD-10-CM

## 2014-11-16 MED ORDER — AZITHROMYCIN 250 MG PO TABS: ORAL_TABLET | ORAL | Status: AC

## 2014-11-16 NOTE — Patient Instructions (Signed)
Drink lots of water. Take antibiotic as prescribed. Return if symptoms not improving in 7-10 days, let me know. Cut back on smoking!!

## 2014-11-16 NOTE — Progress Notes (Signed)
[facility] [address], [place] [address] [phone]  Date:  11/16/2014   Name:  Mason Owens   DOB:  1995-04-03   MRN:  [id number]  PCP:  No primary care provider on file.    Chief Complaint: Cough and Nasal Congestion   History of Present Illness:  This is a 19 y.o. male bipolar disorder who is presenting with a productive cough x 10-12 days. Feels congested in chest. Having some mild nasal congestion. Had sore throat initially but this has resolved. Symptoms are about the same, maybe a little better, since onset. Denies fever or chills. Mom wanted him to come in. Sleeping okay at night, sleep not distrubed from coughing. Denies fever, chills, otalgia. Does not have a hx of asthma or allergies.  Pt is seeing Dr. Jennelle Human once a month for bipolar disorder. Taking latuda and depakote and seems to be working well. Is about to start seeing a counselor at [facility] on 9/20. Patient did a month of outpatient behavioral program with [facility] but states he did not like it very much and stopped going. States his mood has been stable over the summer. He denies SI/HI. He excited about fall approaching. He anticipates his mood being much worse as winter approaches. He has been skateboarding and doing random creative projects like sewing and drawing, all of these activities he likes a lot and improves his mood. He continues to smoke marijuana just as much as before - he smokes all day long. He is aware this could be contributing to his mood but doesn't feel he can stop. He is smoking 10 cigarettes a day. He is not doing any other substances.  Review of Systems:  Review of Systems See HPI  Patient Active Problem List   Diagnosis Date Noted  . ADHD (attention deficit hyperactivity disorder), inattentive type 07/29/2014  . Depression, major, recurrent, moderate 07/13/2014    Class: Chronic  . Bipolar affective disorder 07/06/2014  . Marijuana abuse 07/06/2014    Prior to  Admission medications   Medication Sig Start Date End Date Taking? Authorizing Provider  Armodafinil (NUVIGIL PO) Take by mouth.   Yes Historical Provider, MD  divalproex (DEPAKOTE) 500 MG DR tablet Take 500 mg by mouth daily. Take 1250 mg daily   Yes Historical Provider, MD  Lurasidone HCl (LATUDA) 120 MG TABS Take by mouth. One tablet at bedtime   Yes Historical Provider, MD  Omega-3 Fatty Acids (FISH OIL PO) Take 2,600 mg by mouth daily.   Yes Historical Provider, MD    No Known Allergies  No past surgical history on file.  Social History  Substance Use Topics  . Smoking status: Current Every Day Smoker -- 0.50 packs/day    Types: Cigarettes  . Smokeless tobacco: None  . Alcohol Use: 2.4 - 3.0 oz/week    0 Standard drinks or equivalent, 4-5 Cans of beer per week     Comment: all types of alcohol drinks    Family History  Problem Relation Age of Onset  . ADD / ADHD Father   . Depression Father   . Anxiety disorder Brother   . Depression Maternal Grandmother   . Anxiety disorder Maternal Grandmother     Medication list has been reviewed and updated.  Physical Examination:  Physical Exam  Constitutional: He is oriented to person, place, and time. He appears well-developed and well-nourished. No distress.  HENT:  Head: Normocephalic and atraumatic.  Right Ear: Hearing, tympanic membrane, external  ear and ear canal normal.  Left Ear: Hearing, tympanic membrane, external ear and ear canal normal.  Nose: Nose normal.  Mouth/Throat: Uvula is midline and mucous membranes are normal. Posterior oropharyngeal erythema present. No oropharyngeal exudate or posterior oropharyngeal edema.  Eyes: Conjunctivae and lids are normal. Right eye exhibits no discharge. Left eye exhibits no discharge. No scleral icterus.  Cardiovascular: Normal rate, regular rhythm, normal heart sounds and normal pulses.   No murmur heard. Pulmonary/Chest: Effort normal. No respiratory distress. He has no  wheezes. He has rhonchi (few, scattered). He has no rales.  Abdominal: Soft. Normal appearance. There is no tenderness.  Musculoskeletal: Normal range of motion.  Lymphadenopathy:       Head (right side): No submental, no submandibular and no tonsillar adenopathy present.       Head (left side): No submental, no submandibular and no tonsillar adenopathy present.    He has cervical adenopathy (bilateral, anterior).  Neurological: He is alert and oriented to person, place, and time.  Skin: Skin is warm, dry and intact. No lesion and no rash noted.  Psychiatric: He has a normal mood and affect. His speech is normal and behavior is normal. Thought content normal.   BP 107/65 mmHg  Pulse 62  Temp(Src) 98.6 F (37 C) (Oral)  Resp 16  Ht 6\' 4"  (1.93 m)  Wt 160 lb 9.6 oz (72.848 kg)  BMI 19.56 kg/m2  SpO2 97%  Assessment and Plan:  1. Acute bronchitis, unspecified organism Will treat with zpak. Return in 7-10 days if sx not improved. - azithromycin (ZITHROMAX) 250 MG tablet; Take 2 tabs PO x 1 dose, then 1 tab PO QD x 4 days  Dispense: 6 tablet; Refill: 0  2. Tobacco abuse 3. Marijuana abuse Patient not ready to cut back on marijuana but he is receptive to cutting back on tobacco use esp since he has been since and not smoking as much over the past 1.5 weeks. He will try to cut back to 8 cigarettes a day and continue to cut down as able. Follow up in 3 months.  4. Bipolar affective disorder, current episode mixed, without psychotic features, current episode severity unspecified Mood seems stable at this time. Continue f/u with Dr. Jennelle Human monthly. Hopefully starting therapy in a few weeks will be helpful.   [place]ke Brackett, MHS [facility]  11/16/2014

## 2014-11-30 ENCOUNTER — Ambulatory Visit (INDEPENDENT_AMBULATORY_CARE_PROVIDER_SITE_OTHER): Payer: BLUE CROSS/BLUE SHIELD | Admitting: Family Medicine

## 2014-11-30 VITALS — BP 102/58 | HR 74 | Temp 98.2°F | Resp 16 | Ht 74.75 in | Wt 161.8 lb

## 2014-11-30 DIAGNOSIS — Z202 Contact with and (suspected) exposure to infections with a predominantly sexual mode of transmission: Secondary | ICD-10-CM | POA: Diagnosis not present

## 2014-11-30 MED ORDER — AZITHROMYCIN 500 MG PO TABS: 1000.0000 mg | ORAL_TABLET | Freq: Once | ORAL | Status: DC

## 2014-11-30 NOTE — Progress Notes (Signed)
Subjective:  This chart was scribed for Norberto Sorenson, MD by Broadus John, Medical Scribe. This patient was seen in Room 9 and the patient's care was started at 3:35 PM.   Patient ID: Mason Owens, male    DOB: September 23, 1995, 18 y.o.   MRN: [id number]  Chief Complaint  Patient presents with  . Exposure to STD    HPI HPI Comments: Mason Owens is a 19 y.o. male who [facility] to [facility] complaining exposure to STD.  Pt had a complete STD testing in May that was normal. He was not tested for HSV or trichomonas.  Pt states that his partner has informed him that she was tested positive for STD. Pt denies any rashes, abnormal penile discharge, urinary or bowel complications, changes in vision, myalgia or arthralgia. He indicates that he was never tested positive for STD.    Patient Active Problem List   Diagnosis Date Noted  . Tobacco abuse 11/16/2014  . ADHD (attention deficit hyperactivity disorder), inattentive type 07/29/2014  . Depression, major, recurrent, moderate 07/13/2014    Class: Chronic  . Bipolar affective disorder 07/06/2014  . Marijuana abuse 07/06/2014   Past Medical History  Diagnosis Date  . Bipolar disorder    History reviewed. No pertinent past surgical history. No Known Allergies Prior to Admission medications   Medication Sig Start Date End Date Taking? Authorizing Provider  divalproex (DEPAKOTE) 500 MG DR tablet Take 500 mg by mouth daily. Take 1250 mg daily    Historical Provider, MD  Lurasidone HCl (LATUDA) 120 MG TABS Take by mouth. One tablet at bedtime    Historical Provider, MD  Omega-3 Fatty Acids (FISH OIL PO) Take 2,600 mg by mouth daily.    Historical Provider, MD   Social History   Social History  . Marital Status: Single    Spouse Name: N/A  . Number of Children: N/A  . Years of Education: N/A   Occupational History  . Not on file.   Social History Main Topics  . Smoking status: Current Every Day Smoker -- 0.50  packs/day    Types: Cigarettes  . Smokeless tobacco: Not on file  . Alcohol Use: 2.4 - 3.0 oz/week    0 Standard drinks or equivalent, 4-5 Cans of beer per week     Comment: all types of alcohol drinks  . Drug Use: Yes    Special: Marijuana     Comment: marijuana daily  . Sexual Activity: Yes    Birth Control/ Protection: Condom   Other Topics Concern  . Not on file   Social History Narrative   Depression screen [facility] 2/9 11/30/2014 11/16/2014 07/06/2014  Decreased Interest 0 0 3  Down, Depressed, Hopeless 0 0 3  PHQ - 2 Score 0 0 6  Altered sleeping - - 1  Tired, decreased energy - - 3  Change in appetite - - 2  Feeling bad or failure about yourself  - - 2  Trouble concentrating - - 3  Moving slowly or fidgety/restless - - 1  Suicidal thoughts - - 2  PHQ-9 Score - - 20     Review of Systems  Constitutional: Negative for fever, chills, activity change and appetite change.  Eyes: Negative for visual disturbance.  Gastrointestinal: Negative for abdominal pain, diarrhea and constipation.  Genitourinary: Positive for discharge. Negative for dysuria, urgency, frequency, hematuria, flank pain, decreased urine volume, penile swelling, scrotal swelling, enuresis, difficulty urinating, genital sores, penile pain and testicular  pain.  Musculoskeletal: Negative for myalgias and arthralgias.  Skin: Negative for rash.  Neurological: Negative for weakness.  Hematological: Negative for adenopathy.  Psychiatric/Behavioral: Negative for dysphoric mood.      Objective:   Physical Exam  Constitutional: He is oriented to person, place, and time. He appears well-developed and well-nourished. No distress.  HENT:  Head: Normocephalic and atraumatic.  Eyes: EOM are normal. Pupils are equal, round, and reactive to light.  Neck: Neck supple.  Cardiovascular: Normal rate.   Pulmonary/Chest: Effort normal.  Neurological: He is alert and oriented to person, place, and time. No cranial nerve  deficit.  Skin: Skin is warm and dry.  Psychiatric: He has a normal mood and affect. His behavior is normal.  Nursing note and vitals reviewed.  BP 102/58 mmHg  Pulse 74  Temp(Src) 98.2 F (36.8 C) (Oral)  Resp 16  Ht 6' 2.75" (1.899 m)  Wt 161 lb 12.8 oz (73.392 kg)  BMI 20.35 kg/m2  SpO2 98%     Assessment & Plan:   1. STD exposure   Given ceftriaxone  IM x 1 today and counseled to take azithro  immed. Advised no intercourse x 2 wks, condoms everytime.  Pt refuses blood draw today to complete STI testing. RTC immed if sxs cont  Orders Placed This Encounter  Procedures  . GC/Chlamydia Probe Amp  . Trichomonas vaginalis, RNA    Meds ordered this encounter  Medications  . lamoTRIgine (LAMICTAL) 25 MG tablet    Sig: Take 25 mg by mouth daily.  Marland Kitchen azithromycin (ZITHROMAX) 500 MG tablet    Sig: Take 2 tablets (1,000 mg total) by mouth once.    Dispense:  2 tablet    Refill:  0    I personally performed the services described in this documentation, which was scribed in my presence. The recorded information has been reviewed and considered, and addended by me as needed.  Norberto Sorenson, MD MPH    By signing my name below, I, Rawaa Al Rifaie, attest that this documentation has been prepared under the direction and in the presence of Norberto Sorenson, MD.  Broadus John, Medical Scribe. 11/30/2014.  3:39 PM.

## 2014-11-30 NOTE — Patient Instructions (Signed)
Chlamydia Chlamydia is an infection. It is spread through sexual contact. Chlamydia can be in different areas of the body. These areas include the urethra, throat, or rectum. It is important to treat chlamydia as soon as possible. It can damage other organs.  CAUSES  Chlamydia is caused by bacteria. It is a sexually transmitted disease. This means that it is passed from an infected partner during intimate contact. This contact could be with the genitals, mouth, or rectal area.  SIGNS AND SYMPTOMS  There may not be any symptoms. This is often the case early in the infection. If there are symptoms, they are usually mild and may only be noticeable in the morning. Symptoms you may notice include:   Burning with urination.  Pain or swelling in the testicles.  Watery mucus-like discharge from the penis.  Long-standing (chronic) pelvic pain after frequent infections.  Pain, swelling, or itching around the anus.  A sore throat.  Itching, burning, or redness in the eyes, or discharge from the eyes. DIAGNOSIS  To diagnose this infection, your health care provider will do a pelvic exam. A sample of urine or a swab from the rectum may be taken for testing.  TREATMENT  Chlamydia is treated with antibiotic medicines.  HOME CARE INSTRUCTIONS  Take your antibiotic medicine as directed by your health care provider. Finish the antibiotic even if you start to feel better. Incomplete treatment will put you at risk for not being able to have children (sterility).   Take medicines only as directed by your health care provider.   Rest.   Inform any sexual partners about your infection. Even if they are symptom free or have a negative culture or evaluation, they should be treated for the condition.   Do not have sex (intercourse) until treatment is completed and your health care provider says it is okay.   Keep all follow-up visits as directed by your health care provider.   Not all test results  are available during your visit. If your test results are not back during the visit, make an appointment with your health care provider to find out the results. Do not assume everything is normal if you have not heard from your health care provider or the medical facility. It is your responsibility to get your test results. SEEK MEDICAL CARE IF:  You develop new joint pain.  You have a fever. SEEK IMMEDIATE MEDICAL CARE IF:   Your pain increases.   You have abnormal discharge.   You have pain during intercourse. MAKE SURE YOU:   Understand these instructions.  Will watch your condition.  Will get help right away if you are not doing well or get worse. Document Released: 02/19/2005 Document Revised: 07/06/2013 Document Reviewed: 08/28/2012 ExitCare Patient Information 2015 [employer]. This information is not intended to replace advice given to you by your health care provider. Make sure you discuss any questions you have with your health care provider.  

## 2014-12-01 LAB — GC/CHLAMYDIA PROBE AMP
CT Probe RNA: NEGATIVE
GC Probe RNA: NEGATIVE

## 2014-12-01 LAB — TRICHOMONAS VAGINALIS, PROBE AMP: T vaginalis RNA: NEGATIVE

## 2014-12-02 ENCOUNTER — Encounter: Payer: Self-pay | Admitting: Family Medicine

## 2015-01-26 ENCOUNTER — Ambulatory Visit (INDEPENDENT_AMBULATORY_CARE_PROVIDER_SITE_OTHER): Payer: BLUE CROSS/BLUE SHIELD

## 2015-01-26 ENCOUNTER — Ambulatory Visit (INDEPENDENT_AMBULATORY_CARE_PROVIDER_SITE_OTHER): Payer: BLUE CROSS/BLUE SHIELD | Admitting: Physician Assistant

## 2015-01-26 VITALS — BP 117/72 | HR 68 | Temp 98.5°F | Resp 16 | Ht 75.0 in | Wt 162.4 lb

## 2015-01-26 DIAGNOSIS — R0600 Dyspnea, unspecified: Secondary | ICD-10-CM

## 2015-01-26 DIAGNOSIS — J209 Acute bronchitis, unspecified: Secondary | ICD-10-CM

## 2015-01-26 DIAGNOSIS — R05 Cough: Secondary | ICD-10-CM | POA: Diagnosis not present

## 2015-01-26 DIAGNOSIS — R058 Other specified cough: Secondary | ICD-10-CM

## 2015-01-26 MED ORDER — ALBUTEROL SULFATE HFA 108 (90 BASE) MCG/ACT IN AERS: 2.0000 | INHALATION_SPRAY | RESPIRATORY_TRACT | Status: DC | PRN

## 2015-01-26 MED ORDER — GUAIFENESIN ER 1200 MG PO TB12: 1.0000 | ORAL_TABLET | Freq: Two times a day (BID) | ORAL | Status: DC | PRN

## 2015-01-26 MED ORDER — AZITHROMYCIN 250 MG PO TABS
ORAL_TABLET | ORAL | Status: DC
Start: 2015-01-26 — End: 2015-07-27

## 2015-01-26 NOTE — Patient Instructions (Signed)
Please use the albuterol for the next 2-3 days.  If you find that you need your inhaler more often outside of these days, please let us know, so that we can possibly start an inhaled steroid.   I will be in touch with you in the next 7 days regarding a recommended psychologist.    Acute Bronchitis Bronchitis is inflammation of the airways that extend from the windpipe into the lungs (bronchi). The inflammation often causes mucus to develop. This leads to a cough, which is the most common symptom of bronchitis.  In acute bronchitis, the condition usually develops suddenly and goes away over time, usually in a couple weeks. Smoking, allergies, and asthma can make bronchitis worse. Repeated episodes of bronchitis may cause further lung problems.  CAUSES Acute bronchitis is most often caused by the same virus that causes a cold. The virus can spread from person to person (contagious) through coughing, sneezing, and touching contaminated objects. SIGNS AND SYMPTOMS   Cough.   Fever.   Coughing up mucus.   Body aches.   Chest congestion.   Chills.   Shortness of breath.   Sore throat.  DIAGNOSIS  Acute bronchitis is usually diagnosed through a physical exam. Your health care provider will also ask you questions about your medical history. Tests, such as chest X-rays, are sometimes done to rule out other conditions.  TREATMENT  Acute bronchitis usually goes away in a couple weeks. Oftentimes, no medical treatment is necessary. Medicines are sometimes given for relief of fever or cough. Antibiotic medicines are usually not needed but may be prescribed in certain situations. In some cases, an inhaler may be recommended to help reduce shortness of breath and control the cough. A cool mist vaporizer may also be used to help thin bronchial secretions and make it easier to clear the chest.  HOME CARE INSTRUCTIONS  Get plenty of rest.   Drink enough fluids to keep your urine clear or pale  yellow (unless you have a medical condition that requires fluid restriction). Increasing fluids may help thin your respiratory secretions (sputum) and reduce chest congestion, and it will prevent dehydration.   Take medicines only as directed by your health care provider.  If you were prescribed an antibiotic medicine, finish it all even if you start to feel better.  Avoid smoking and secondhand smoke. Exposure to cigarette smoke or irritating chemicals will make bronchitis worse. If you are a smoker, consider using nicotine gum or skin patches to help control withdrawal symptoms. Quitting smoking will help your lungs heal faster.   Reduce the chances of another bout of acute bronchitis by washing your hands frequently, avoiding people with cold symptoms, and trying not to touch your hands to your mouth, nose, or eyes.   Keep all follow-up visits as directed by your health care provider.  SEEK MEDICAL CARE IF: Your symptoms do not improve after 1 week of treatment.  SEEK IMMEDIATE MEDICAL CARE IF:  You develop an increased fever or chills.   You have chest pain.   You have severe shortness of breath.  You have bloody sputum.   You develop dehydration.  You faint or repeatedly feel like you are going to pass out.  You develop repeated vomiting.  You develop a severe headache. MAKE SURE YOU:   Understand these instructions.  Will watch your condition.  Will get help right away if you are not doing well or get worse.   This information is not intended to replace advice  given to you by your health care provider. Make sure you discuss any questions you have with your health care provider.   Document Released: 03/29/2004 Document Revised: 03/12/2014 Document Reviewed: 08/12/2012 Elsevier Interactive Patient Education [employer].
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
## 2015-07-09 ENCOUNTER — Other Ambulatory Visit: Payer: Self-pay | Admitting: Physician Assistant

## 2015-07-21 ENCOUNTER — Ambulatory Visit: Payer: BLUE CROSS/BLUE SHIELD

## 2015-07-27 ENCOUNTER — Ambulatory Visit (INDEPENDENT_AMBULATORY_CARE_PROVIDER_SITE_OTHER): Payer: BLUE CROSS/BLUE SHIELD | Admitting: Urgent Care

## 2015-07-27 VITALS — BP 112/70 | HR 66 | Temp 97.4°F | Resp 16 | Ht 75.0 in | Wt 170.8 lb

## 2015-07-27 DIAGNOSIS — Z113 Encounter for screening for infections with a predominantly sexual mode of transmission: Secondary | ICD-10-CM

## 2015-07-27 LAB — HIV ANTIBODY (ROUTINE TESTING W REFLEX): HIV 1&2 Ab, 4th Generation: NONREACTIVE

## 2015-07-27 NOTE — Progress Notes (Signed)
    MRN: [id number] DOB: 05/23/1995  Subjective:   Mason Owens is a 20 y.o. male presenting for STD testing  Patient would like to be screened for STIs. Patient is sexually active, uses condoms for protection. Denies fever, n/v, abdominal or pelvic pain, penile discharge, genital rashes, groin pain, testicular swelling or pain, hematuria. Denies ever having had an STI.   Trigger has a current medication list which includes the following prescription(s): bupropion hcl, divalproex, lurasidone hcl, omega-3 fatty acids, and proair hfa. Also has No Known Allergies.  Mason Owens  has a past medical history of Bipolar disorder (HCC). Also  has no past surgical history on file.  Objective:   Vitals: BP 112/70 mmHg  Pulse 66  Temp(Src) 97.4 F (36.3 C) (Oral)  Resp 16  Ht 6\' 3"  (1.905 m)  Wt 170 lb 12.8 oz (77.474 kg)  BMI 21.35 kg/m2  SpO2 100%  Physical Exam  Constitutional: He is oriented to person, place, and time. He appears well-developed and well-nourished.  Cardiovascular: Normal rate.   Pulmonary/Chest: Effort normal.  Neurological: He is alert and oriented to person, place, and time.  Skin: Skin is warm and dry.  Psychiatric: He has a normal mood and affect.   Assessment and Plan :   1. Screen for STD (sexually transmitted disease) - Labs pending, reinforced safe sex practices. Okay to leave VM on cell phone.  Mason BambergMario Alexie Samson, PA-C [facility] [phone] 07/27/2015 12:26 PM

## 2015-07-27 NOTE — Patient Instructions (Addendum)
Safe Sex Safe sex is about reducing the risk of giving or getting a sexually transmitted disease (STD). STDs are spread through sexual contact involving the genitals, mouth, or rectum. Some STDs can be cured and others cannot. Safe sex can also prevent unintended pregnancies.  WHAT ARE SOME SAFE SEX PRACTICES?  Limit your sexual activity to only one partner who is having sex with only you.  Talk to your partner about his or her past partners, past STDs, and drug use.  Use a condom every time you have sexual intercourse. This includes vaginal, oral, and anal sexual activity. Both females and males should wear condoms during oral sex. Only use latex or polyurethane condoms and water-based lubricants. Using petroleum-based lubricants or oils to lubricate a condom will weaken the condom and increase the chance that it will break. The condom should be in place from the beginning to the end of sexual activity. Wearing a condom reduces, but does not completely eliminate, your risk of getting or giving an STD. STDs can be spread by contact with infected body fluids and skin.  Get vaccinated for hepatitis B and HPV.  Avoid alcohol and recreational drugs, which can affect your judgment. You may forget to use a condom or participate in high-risk sex.  For females, avoid douching after sexual intercourse. Douching can spread an infection farther into the reproductive tract.  Check your body for signs of sores, blisters, rashes, or unusual discharge. See your health care provider if you notice any of these signs.  Avoid sexual contact if you have symptoms of an infection or are being treated for an STD. If you or your partner has herpes, avoid sexual contact when blisters are present. Use condoms at all other times.  If you are at risk of being infected with HIV, it is recommended that you take a prescription medicine daily to prevent HIV infection. This is called pre-exposure prophylaxis (PrEP). You are  considered at risk if:  You are a man who has sex with other men (MSM).  You are a heterosexual man or woman who is sexually active with more than one partner.  You take drugs by injection.  You are sexually active with a partner who has HIV.  Talk with your health care provider about whether you are at high risk of being infected with HIV. If you choose to begin PrEP, you should first be tested for HIV. You should then be tested every 3 months for as long as you are taking PrEP.  See your health care provider for regular screenings, exams, and tests for other STDs. Before having sex with a new partner, each of you should be screened for STDs and should talk about the results with each other. WHAT ARE THE BENEFITS OF SAFE SEX?   There is less chance of getting or giving an STD.  You can prevent unwanted or unintended pregnancies.  By discussing safe sex concerns with your partner, you may increase feelings of intimacy, comfort, trust, and honesty between the two of you.   This information is not intended to replace advice given to you by your health care provider. Make sure you discuss any questions you have with your health care provider.   Document Released: 03/29/2004 Document Revised: 03/12/2014 Document Reviewed: 08/13/2011 Elsevier Interactive Patient Education 2016 Elsevier Inc.     IF you received an x-ray today, you will receive an invoice from [facility]. Please contact [facility] at [phone] with questions or concerns regarding your invoice.     IF you received labwork today, you will receive an invoice from [facility]. Please contact [facility] at [phone] with questions or concerns regarding your invoice.   Our billing staff will not be able to assist you with questions regarding bills from these companies.  You will be contacted with the lab results as soon as they are available. The fastest way to get your results  is to activate your My Chart account. Instructions are located on the last page of this paperwork. If you have not heard from us regarding the results in 2 weeks, please contact this office.      

## 2015-07-28 LAB — GC/CHLAMYDIA PROBE AMP
CT Probe RNA: NOT DETECTED
GC Probe RNA: NOT DETECTED

## 2015-07-29 LAB — RPR

## 2015-08-22 ENCOUNTER — Ambulatory Visit (INDEPENDENT_AMBULATORY_CARE_PROVIDER_SITE_OTHER): Payer: BLUE CROSS/BLUE SHIELD | Admitting: Family Medicine

## 2015-08-22 VITALS — BP 102/58 | HR 77 | Temp 98.3°F | Resp 16 | Ht 74.0 in | Wt 162.0 lb

## 2015-08-22 DIAGNOSIS — N4889 Other specified disorders of penis: Secondary | ICD-10-CM

## 2015-08-22 DIAGNOSIS — B3742 Candidal balanitis: Secondary | ICD-10-CM | POA: Diagnosis not present

## 2015-08-22 DIAGNOSIS — B3749 Other urogenital candidiasis: Secondary | ICD-10-CM

## 2015-08-22 LAB — POCT URINALYSIS DIP (MANUAL ENTRY)
GLUCOSE UA: NEGATIVE
Nitrite, UA: NEGATIVE
SPEC GRAV UA: 1.02
UROBILINOGEN UA: 1
pH, UA: 7

## 2015-08-22 LAB — POC MICROSCOPIC URINALYSIS (UMFC): Mucus: ABSENT

## 2015-08-22 MED ORDER — FLUCONAZOLE 150 MG PO TABS: 150.0000 mg | ORAL_TABLET | Freq: Once | ORAL | Status: DC

## 2015-08-22 MED ORDER — KETOCONAZOLE 2 % EX CREA: 1.0000 "application " | TOPICAL_CREAM | Freq: Two times a day (BID) | CUTANEOUS | Status: DC

## 2015-08-22 NOTE — Patient Instructions (Addendum)
IF you received an x-ray today, you will receive an invoice from [facility]. Please contact [facility] at [phone] with questions or concerns regarding your invoice.   IF you received labwork today, you will receive an invoice from [facility]. Please contact [facility] at [phone] with questions or concerns regarding your invoice.   Our billing staff will not be able to assist you with questions regarding bills from these companies.  You will be contacted with the lab results as soon as they are available. The fastest way to get your results is to activate your My Chart account. Instructions are located on the last page of this paperwork. If you have not heard from us regarding the results in 2 weeks, please contact this office.    Candida Infection, Adult A Candida infection (also called yeast, fungus, and Monilia infection) is an overgrowth of yeast that can occur anywhere on the body. A yeast infection commonly occurs in warm, moist body areas. Usually, the infection remains localized but can spread to become a systemic infection. A yeast infection may be a sign of a more severe disease such as diabetes, leukemia, or AIDS. A yeast infection can occur in both men and women. In women, Candida vaginitis is a vaginal infection. It is one of the most common causes of vaginitis. Men usually do not have symptoms or know they have an infection until other problems develop. Men may find out they have a yeast infection because their sex partner has a yeast infection. Uncircumcised men are more likely to get a yeast infection than circumcised men. This is because the uncircumcised glans is not exposed to air and does not remain as dry as that of a circumcised glans. Older adults may develop yeast infections around dentures. CAUSES  Women  Antibiotics.  Steroid medication taken for a long time.  Being overweight (obese).  Diabetes.  Poor  immune condition.  Certain serious medical conditions.  Immune suppressive medications for organ transplant patients.  Chemotherapy.  Pregnancy.  Menstruation.  Stress and fatigue.  Intravenous drug use.  Oral contraceptives.  Wearing tight-fitting clothes in the crotch area.  Catching it from a sex partner who has a yeast infection.  Spermicide.  Intravenous, urinary, or other catheters. Men  Catching it from a sex partner who has a yeast infection.  Having oral or anal sex with a person who has the infection.  Spermicide.  Diabetes.  Antibiotics.  Poor immune system.  Medications that suppress the immune system.  Intravenous drug use.  Intravenous, urinary, or other catheters. SYMPTOMS  Women  Thick, white vaginal discharge.  Vaginal itching.  Redness and swelling in and around the vagina.  Irritation of the lips of the vagina and perineum.  Blisters on the vaginal lips and perineum.  Painful sexual intercourse.  Low blood sugar (hypoglycemia).  Painful urination.  Bladder infections.  Intestinal problems such as constipation, indigestion, bad breath, bloating, increase in gas, diarrhea, or loose stools. Men  Men may develop intestinal problems such as constipation, indigestion, bad breath, bloating, increase in gas, diarrhea, or loose stools.  Dry, cracked skin on the penis with itching or discomfort.  Jock itch.  Dry, flaky skin.  Athlete's foot.  Hypoglycemia. DIAGNOSIS  Women  A history and an exam are performed.  The discharge may be examined under a microscope.  A culture may be taken of the discharge. Men  A history and an exam are performed.  Any discharge from the  penis or areas of cracked skin will be looked at under the microscope and cultured.  Stool samples may be cultured. TREATMENT  Women  Vaginal antifungal suppositories and creams.  Medicated creams to decrease irritation and itching on the outside  of the vagina.  Warm compresses to the perineal area to decrease swelling and discomfort.  Oral antifungal medications.  Medicated vaginal suppositories or cream for repeated or recurrent infections.  Wash and dry the irritation areas before applying the cream.  Eating yogurt with Lactobacillus may help with prevention and treatment.  Sometimes painting the vagina with gentian violet solution may help if creams and suppositories do not work. Men  Antifungal creams and oral antifungal medications.  Sometimes treatment must continue for 30 days after the symptoms go away to prevent recurrence. HOME CARE INSTRUCTIONS  Women  Use cotton underwear and avoid tight-fitting clothing.  Avoid colored, scented toilet paper and deodorant tampons or pads.  Do not douche.  Keep your diabetes under control.  Finish all the prescribed medications.  Keep your skin clean and dry.  Consume milk or yogurt with Lactobacillus-active culture regularly. If you get frequent yeast infections and think that is what the infection is, there are over-the-counter medications that you can get. If the infection does not show healing in 3 days, talk to your caregiver.  Tell your sex partner you have a yeast infection. Your partner may need treatment also, especially if your infection does not clear up or recurs. Men  Keep your skin clean and dry.  Keep your diabetes under control.  Finish all prescribed medications.  Tell your sex partner that you have a yeast infection so he or she can be treated if necessary. SEEK MEDICAL CARE IF:   Your symptoms do not clear up or worsen in one week after treatment.  You have an oral temperature above 102 F (38.9 C).  You have trouble swallowing or eating for a prolonged time.  You develop blisters on and around your vagina.  You develop vaginal bleeding and it is not your menstrual period.  You develop abdominal pain.  You develop intestinal problems  as mentioned above.  You get weak or light-headed.  You have painful or increased urination.  You have pain during sexual intercourse. MAKE SURE YOU:   Understand these instructions.  Will watch your condition.  Will get help right away if you are not doing well or get worse.   This information is not intended to replace advice given to you by your health care provider. Make sure you discuss any questions you have with your health care provider.   Document Released: 03/29/2004 Document Revised: 03/12/2014 Document Reviewed: 08/23/2014 Elsevier Interactive Patient Education [employer]

## 2015-08-22 NOTE — Progress Notes (Signed)
By signing my name below, I, [name], attest that this documentation has been prepared under the direction and in the presence of Norberto SorensonEva Shaw, MD.  Electronically Signed: Arvilla MarketMesha Guinyard, Medical Scribe. 08/22/2015. 3:12 PM.  Subjective:    Patient ID: Mason BundeHudson R Branson, male    DOB: September 26, 1995, 20 y.o.   MRN: [id number]  HPI  Chief Complaint  Patient presents with  . groin itching    per patient at penis area x 2 days    HPI Comments: Mason Owens is a 20 y.o. male who [facility] complaining of penile itchiness onset 2 days ago. Pt had exposure to a yeast infection during intercourse 5-6 days ago. Pt hasn't had sex since then. Pt reports dysuria. Pt mentions he had some constipation this morning, and doesn't think it's related to cc. Pt has had 2 new sexual partners since his last STD check- deferred testing today. Pt hasn't taken any medication for his symptoms. Pt denies urinary frequency, diarrhea, change in color of urine, and penile discharge.  Patient Active Problem List   Diagnosis Date Noted  . Tobacco abuse 11/16/2014  . ADHD (attention deficit hyperactivity disorder), inattentive type 07/29/2014  . Depression, major, recurrent, moderate (HCC) 07/13/2014    Class: Chronic  . Bipolar affective disorder (HCC) 07/06/2014  . Marijuana abuse 07/06/2014   Past Medical History  Diagnosis Date  . Bipolar disorder (HCC)    No past surgical history on file. No Known Allergies Prior to Admission medications   Medication Sig Start Date End Date Taking? Authorizing Provider  BuPROPion HCl (WELLBUTRIN PO) Take by mouth.   Yes Historical Provider, MD  divalproex (DEPAKOTE) 500 MG DR tablet Take 500 mg by mouth daily. Take 1250 mg daily   Yes Historical Provider, MD  Lurasidone HCl 120 MG TABS Take by mouth.   Yes Historical Provider, MD  Omega-3 Fatty Acids (FISH OIL PO) Take 2,600 mg by mouth daily. Reported on 08/22/2015   Yes Historical  Provider, MD  PROAIR HFA 108 [phone](90 Base) MCG/ACT inhaler USE 2 INHALATIONS INTO THE LUNGS EVERY 4 HOURS AS NEEDED FOR WHEEZING/SHORTNESS OF BREATH/COUGH 07/11/15  Yes Sherren MochaEva N Shaw, MD   Social History   Social History  . Marital Status: Single    Spouse Name: N/A  . Number of Children: N/A  . Years of Education: N/A   Occupational History  . Not on file.   Social History Main Topics  . Smoking status: Current Every Day Smoker -- 0.50 packs/day    Types: Cigarettes  . Smokeless tobacco: Never Used  . Alcohol Use: 2.4 - 3.0 oz/week    0 Standard drinks or equivalent, 4-5 Cans of beer per week     Comment: all types of alcohol drinks  . Drug Use: Yes    Special: Marijuana     Comment: marijuana daily  . Sexual Activity: Yes    Birth Control/ Protection: Condom   Other Topics Concern  . Not on file   Social History Narrative   Dep[facility] screen [facility] 2/9 08/22/2015 07/27/2015 01/26/2015 11/30/2014 11/16/2014  Decreased Interest 0 0 0 0 0  Down, Depressed, Hopeless 0 0 0 0 0  PHQ - 2 Score 0 0 0 0 0  Altered sleeping - - - - -  Tired, decreased energy - - - - -  Change in appetite - - - - -  Feeling bad or failure about yourself  - - - - -  Trouble concentrating - - - - -  Moving slowly or fidgety/restless - - - - -  Suicidal thoughts - - - - -  PHQ-9 Score - - - - -   Review of Systems  Constitutional: Negative for fever, chills, activity change and appetite change.  Gastrointestinal: Positive for constipation. Negative for diarrhea.  Genitourinary: Positive for dysuria. Negative for frequency, hematuria, discharge, difficulty urinating, penile pain and testicular pain.  Allergic/Immunologic: Negative for immunocompromised state.  Hematological: Negative for adenopathy.    Objective:  BP 102/58 mmHg  Pulse 77  Temp(Src) 98.3 F (36.8 C) (Oral)  Resp 16  Ht  (1.88 m)  Wt 162 lb (73.483 kg)  BMI 20.79 kg/m2  SpO2 97%  Physical Exam  Constitutional: He appears  well-developed and well-nourished. No distress.  HENT:  Head: Normocephalic and atraumatic.  Eyes: Conjunctivae are normal.  Neck: Neck supple.  Cardiovascular: Normal rate.   Pulmonary/Chest: Effort normal.  Genitourinary:  Small amount of macular erythema around the urethra No irritation inside the urethra Rest of glands was nl Small abrasion on mid penile shaft 2 o clock Scrotum nl No discharge  Neurological: He is alert.  Skin: Skin is warm and dry.  Psychiatric: He has a normal mood and affect. His behavior is normal.  Nursing note and vitals reviewed.   Assessment & Plan:   1. Penile irritation   2. Genital candidiasis in male   Pt declines repeat sti testing today as had neg 1 mo prev and "knows" both of his 2 partners since then are "clean". Appears to have a small amount of skin irritation around the urethra with mild itching, partner had a yeast infection, so will try to treat for yeast. Pt will RTC if sxs do not resolve.  Orders Placed This Encounter  Procedures  . Urine culture  . POCT urinalysis dipstick  . POCT Microscopic Urinalysis ([facility])    Meds ordered this encounter  Medications  . fluconazole (DIFLUCAN) 150 MG tablet    Sig: Take 1 tablet (150 mg total) by mouth once. Repeat if needed after 3 days    Dispense:  2 tablet    Refill:  0  . ketoconazole (NIZORAL) 2 % cream    Sig: Apply 1 application topically 2 (two) times daily.    Dispense:  60 g    Refill:  0    I personally performed the services described in this documentation, which was scribed in my presence. The recorded information has been reviewed and considered, and addended by me as needed.   Norberto Sorenson, M.D.  [facility] [address] [place], [place] [address] [phone] phone [phone] fax  08/22/2015 4:07 PM

## 2015-08-23 ENCOUNTER — Telehealth: Payer: Self-pay

## 2015-08-23 LAB — URINE CULTURE
COLONY COUNT: NO GROWTH
Organism ID, Bacteria: NO GROWTH

## 2015-08-23 NOTE — Telephone Encounter (Addendum)
Pharmacy is calling about medication interaction between fluconazole and lurasidone [facility]  Best number [phone]

## 2015-08-24 NOTE — Telephone Encounter (Signed)
I called pharmacist and advised her.

## 2015-08-24 NOTE — Telephone Encounter (Signed)
dont fill the fluconazole, he can use the topical cream I rxed instead.

## 2017-12-06 ENCOUNTER — Other Ambulatory Visit: Payer: Self-pay

## 2017-12-06 MED ORDER — LURASIDONE HCL 120 MG PO TABS: 120.0000 mg | ORAL_TABLET | Freq: Every day | ORAL | 2 refills | Status: DC

## 2018-01-06 ENCOUNTER — Other Ambulatory Visit: Payer: Self-pay | Admitting: Psychiatry

## 2018-01-21 ENCOUNTER — Other Ambulatory Visit: Payer: Self-pay

## 2018-01-21 MED ORDER — DIVALPROEX SODIUM 500 MG PO DR TAB: 1500.0000 mg | DELAYED_RELEASE_TABLET | Freq: Every day | ORAL | 0 refills | Status: DC

## 2018-02-06 ENCOUNTER — Encounter: Payer: Self-pay | Admitting: Emergency Medicine

## 2018-02-14 ENCOUNTER — Other Ambulatory Visit: Payer: Self-pay | Admitting: Psychiatry

## 2018-02-17 NOTE — Telephone Encounter (Signed)
Need to review chart  

## 2018-02-19 ENCOUNTER — Other Ambulatory Visit: Payer: Self-pay | Admitting: Psychiatry

## 2018-02-19 NOTE — Telephone Encounter (Signed)
Has office visit tomorrow with provider  

## 2018-02-20 ENCOUNTER — Encounter: Payer: Self-pay | Admitting: Psychiatry

## 2018-02-20 ENCOUNTER — Ambulatory Visit (INDEPENDENT_AMBULATORY_CARE_PROVIDER_SITE_OTHER): Payer: No Typology Code available for payment source | Admitting: Psychiatry

## 2018-02-20 DIAGNOSIS — F319 Bipolar disorder, unspecified: Secondary | ICD-10-CM | POA: Diagnosis not present

## 2018-02-20 DIAGNOSIS — F900 Attention-deficit hyperactivity disorder, predominantly inattentive type: Secondary | ICD-10-CM

## 2018-02-20 MED ORDER — DEXMETHYLPHENIDATE HCL ER 10 MG PO CP24: 10.0000 mg | ORAL_CAPSULE | Freq: Every morning | ORAL | 0 refills | Status: DC

## 2018-02-20 MED ORDER — DEXMETHYLPHENIDATE HCL ER 10 MG PO CP24: 10.0000 mg | ORAL_CAPSULE | Freq: Every day | ORAL | 0 refills | Status: DC

## 2018-02-20 NOTE — Progress Notes (Signed)
Mason BundeHudson R Koury [id number] 1995/09/06 22 y.o.  Subjective:   Patient ID:  Mason Owens is a 22 y.o. (DOB 1995/09/06) male.  Chief Complaint:  Chief Complaint  Patient presents with  . Depression  . Anxiety  . ADD    HPI Mason BundeHudson R Swander presents to the office today for follow-up of above.  Overall pretty good re: mood and anxiety.  Still dating same girl for a year.  3 jobs. Residual depression and anxiety.  No crazy stuff bc has work to do.  Would be nice if he had 1 job and could be more routine.  No concerns about meds.  Having to adjust bc for a long time I wasn't doing anything and having to get used to be being busy.  Patient reports stable mood and denies depressed or irritable moods.  Patient denies any recent difficulty with anxiety.  Patient denies difficulty with sleep initiation or maintenance. Denies appetite disturbance.  Patient reports that energy and motivation have been good.  Patient denies any difficulty with concentration.  Patient denies any suicidal ideation.     Review of Systems:  Review of Systems  Musculoskeletal: Positive for back pain.  Psychiatric/Behavioral: Positive for decreased concentration and dysphoric mood. Negative for agitation, behavioral problems, confusion, hallucinations, self-injury, sleep disturbance and suicidal ideas. The patient is nervous/anxious. The patient is not hyperactive.     Medications: I have reviewed the patient's current medications.  Current Outpatient Medications  Medication Sig Dispense Refill  . buPROPion (WELLBUTRIN XL) 150 MG 24 hr tablet Take 2 tablets (300 mg total) by mouth daily. 180 tablet 0  . dexmethylphenidate (FOCALIN XR) 10 MG 24 hr capsule Take 10 mg by mouth every morning.    . divalproex (DEPAKOTE) 500 MG DR tablet TAKE 3 TABLETS (1,500 MG TOTAL) BY MOUTH DAILY. TAKE 1250 MG DAILY 90 tablet 0  . ketoconazole (NIZORAL) 2 % cream Apply 1 application topically 2 (two) times daily. 60 g 0  .  Lurasidone HCl 120 MG TABS Take 1 tablet (120 mg total) by mouth daily. 30 tablet 2  . Omega-3 Fatty Acids (FISH OIL PO) Take 2,600 mg by mouth daily. Reported on 08/22/2015    . PROAIR HFA 108 (90 Base) MCG/ACT inhaler USE 2 INHALATIONS INTO THE LUNGS EVERY 4 HOURS AS NEEDED FOR WHEEZING/SHORTNESS OF BREATH/COUGH 8.5 Inhaler 0  . fluconazole (DIFLUCAN) 150 MG tablet Take 1 tablet (150 mg total) by mouth once. Repeat if needed after 3 days 2 tablet 0   No current facility-administered medications for this visit.     Medication Side Effects: None  Allergies: No Known Allergies  Past Medical History:  Diagnosis Date  . Bipolar disorder (HCC)     Family History  Problem Relation Age of Onset  . ADD / ADHD Father   . Depression Father   . Anxiety disorder Brother   . Depression Maternal Grandmother   . Anxiety disorder Maternal Grandmother     Social History   Socioeconomic History  . Marital status: Single    Spouse name: Not on file  . Number of children: Not on file  . Years of education: Not on file  . Highest education level: Not on file  Occupational History  . Not on file  Social Needs  . Financial resource strain: Not on file  . Food insecurity:    Worry: Not on file    Inability: Not on file  . Transportation needs:    Medical: Not on file  Non-medical: Not on file  Tobacco Use  . Smoking status: Current Every Day Smoker    Packs/day: 0.50    Types: Cigarettes  . Smokeless tobacco: Never Used  Substance and Sexual Activity  . Alcohol use: Yes    Alcohol/week: 4.0 - 5.0 standard drinks    Types: 4 - 5 Cans of beer per week    Comment: all types of alcohol drinks  . Drug use: Yes    Types: Marijuana    Comment: marijuana daily  . Sexual activity: Yes    Birth control/protection: Condom  Lifestyle  . Physical activity:    Days per week: Not on file    Minutes per session: Not on file  . Stress: Not on file  Relationships  . Social connections:     Talks on phone: Not on file    Gets together: Not on file    Attends religious service: Not on file    Active member of club or organization: Not on file    Attends meetings of clubs or organizations: Not on file    Relationship status: Not on file  . Intimate partner violence:    Fear of current or ex partner: Not on file    Emotionally abused: Not on file    Physically abused: Not on file    Forced sexual activity: Not on file  Other Topics Concern  . Not on file  Social History Narrative  . Not on file    Past Medical History, Surgical history, Social history, and Family history were reviewed and updated as appropriate.   Please see review of systems for further details on the patient's review from today.   Objective:   Physical Exam:  There were no vitals taken for this visit.  Physical Exam Constitutional:      General: He is not in acute distress.    Appearance: He is well-developed and normal weight.  Musculoskeletal:        General: No deformity.  Neurological:     Mental Status: He is alert and oriented to person, place, and time.     Motor: No tremor.     Coordination: Coordination normal.     Gait: Gait normal.  Psychiatric:        Attention and Perception: Attention normal. He is attentive.        Mood and Affect: Mood is not anxious or depressed. Affect is not labile, blunt, angry or inappropriate.        Speech: Speech normal.        Behavior: Behavior normal.        Thought Content: Thought content normal. Thought content does not include homicidal or suicidal ideation. Thought content does not include homicidal or suicidal plan.        Cognition and Memory: Cognition normal.        Judgment: Judgment normal.     Comments: Insight intact. No auditory or visual hallucinations. No delusions.      Lab Review:     Component Value Date/Time   NA 141 07/06/2014 1458   K 4.2 07/06/2014 1458   CL 104 07/06/2014 1458   CO2 27 07/06/2014 1458   GLUCOSE 81  07/06/2014 1458   BUN 12 07/06/2014 1458   CREATININE 0.70 07/06/2014 1458   CALCIUM 9.4 07/06/2014 1458   PROT 7.2 07/06/2014 1458   ALBUMIN 4.7 07/06/2014 1458   AST 18 07/06/2014 1458   ALT 8 07/06/2014 1458   ALKPHOS 44 07/06/2014  1458   BILITOT 0.5 07/06/2014 1458       Component Value Date/Time   WBC 4.4 07/06/2014 1458   RBC 4.85 07/06/2014 1458   HGB 14.7 07/06/2014 1458   HCT 44.2 07/06/2014 1458   PLT 173 07/06/2014 1458   MCV 91.1 07/06/2014 1458   MCH 30.3 07/06/2014 1458   MCHC 33.3 07/06/2014 1458   RDW 14.3 07/06/2014 1458    No results found for: POCLITH, LITHIUM   No results found for: PHENYTOIN, PHENOBARB, VALPROATE, CBMZ   .res Assessment: Plan:    Bipolar I disorder (HCC)  Attention deficit hyperactivity disorder (ADHD), predominantly inattentive type   Relatively stable with benefit from meds.  Disc diet and appetite reduction with the meds and need for food with Latuda and types of diet in response to his questions.  Discussed potential metabolic side effects associated with atypical antipsychotics, as well as potential risk for movement side effects. Advised pt to contact office if movement side effects occur.   Discussed potential benefits, risks, and side effects of stimulants with patient to include increased heart rate, palpitations, insomnia, increased anxiety, increased irritability, or decreased appetite.  Instructed patient to contact office if experiencing any significant tolerability issues.  He is aware to avoid THC in daytime with meds.  This appointment was 15 minutes  Fu 4 mo  Meredith Staggers, MD, DFAPA  Please see After Visit Summary for patient specific instructions.  No future appointments.  No orders of the defined types were placed in this encounter.     -------------------------------

## 2018-02-21 ENCOUNTER — Other Ambulatory Visit: Payer: Self-pay

## 2018-03-03 ENCOUNTER — Other Ambulatory Visit: Payer: Self-pay | Admitting: Psychiatry

## 2018-03-03 ENCOUNTER — Telehealth: Payer: Self-pay | Admitting: Psychiatry

## 2018-03-03 MED ORDER — DIVALPROEX SODIUM ER 500 MG PO TB24: 1500.0000 mg | ORAL_TABLET | Freq: Every day | ORAL | 3 refills | Status: DC

## 2018-03-03 NOTE — Telephone Encounter (Signed)
Mother called and said that the pharmacy has a question about the depakote 500 mg. You escribed dr and the pharmacy says that he has been taking 500mg  er. Is this an error? Please correct . Chart is in your box

## 2018-03-03 NOTE — Progress Notes (Signed)
Erroneously Depakote DR was ordered instead of Depakote ER.  New prescription sent to pharmacy for Depakote ER 500 mg tablets 3 nightly #90 and 3 refills  Meredith Staggersarey Cottle, MD, DFAPA

## 2018-03-15 ENCOUNTER — Other Ambulatory Visit: Payer: Self-pay | Admitting: Psychiatry

## 2018-03-18 ENCOUNTER — Other Ambulatory Visit: Payer: Self-pay | Admitting: Psychiatry

## 2018-03-31 ENCOUNTER — Other Ambulatory Visit: Payer: Self-pay | Admitting: Psychiatry

## 2018-05-27 ENCOUNTER — Other Ambulatory Visit: Payer: Self-pay | Admitting: Psychiatry

## 2018-06-18 ENCOUNTER — Other Ambulatory Visit: Payer: Self-pay

## 2018-06-18 ENCOUNTER — Telehealth: Payer: Self-pay | Admitting: Psychiatry

## 2018-06-18 DIAGNOSIS — F900 Attention-deficit hyperactivity disorder, predominantly inattentive type: Secondary | ICD-10-CM

## 2018-06-18 NOTE — Telephone Encounter (Signed)
Pt left message [facility]

## 2018-06-18 NOTE — Telephone Encounter (Signed)
Looks like he should have refills on file, I will check with pharmacy to confirm first

## 2018-06-18 NOTE — Telephone Encounter (Signed)
Pt does have rx's on file and instructed to check with his pharmacy

## 2018-06-24 ENCOUNTER — Other Ambulatory Visit: Payer: Self-pay

## 2018-06-24 ENCOUNTER — Encounter: Payer: No Typology Code available for payment source | Admitting: Psychiatry

## 2018-06-24 NOTE — Progress Notes (Signed)
This encounter was created in error - please disregard.

## 2018-06-26 ENCOUNTER — Other Ambulatory Visit: Payer: Self-pay | Admitting: Psychiatry

## 2018-07-25 ENCOUNTER — Other Ambulatory Visit: Payer: Self-pay | Admitting: Psychiatry

## 2018-08-18 ENCOUNTER — Other Ambulatory Visit: Payer: Self-pay | Admitting: Psychiatry

## 2018-08-19 ENCOUNTER — Other Ambulatory Visit: Payer: Self-pay | Admitting: Psychiatry

## 2018-08-19 NOTE — Telephone Encounter (Signed)
Call to schedule appt

## 2018-08-19 NOTE — Telephone Encounter (Signed)
Over due for appt, last seen 02/2018  Nothing scheduled.

## 2018-08-19 NOTE — Telephone Encounter (Signed)
Needs appt

## 2018-08-21 ENCOUNTER — Ambulatory Visit (INDEPENDENT_AMBULATORY_CARE_PROVIDER_SITE_OTHER): Payer: No Typology Code available for payment source | Admitting: Psychiatry

## 2018-08-21 ENCOUNTER — Other Ambulatory Visit: Payer: Self-pay

## 2018-08-21 ENCOUNTER — Encounter: Payer: Self-pay | Admitting: Psychiatry

## 2018-08-21 DIAGNOSIS — F900 Attention-deficit hyperactivity disorder, predominantly inattentive type: Secondary | ICD-10-CM

## 2018-08-21 DIAGNOSIS — F3175 Bipolar disorder, in partial remission, most recent episode depressed: Secondary | ICD-10-CM

## 2018-08-21 MED ORDER — DEXMETHYLPHENIDATE HCL ER 10 MG PO CP24: 10.0000 mg | ORAL_CAPSULE | Freq: Every day | ORAL | 0 refills | Status: DC

## 2018-08-21 MED ORDER — DEXMETHYLPHENIDATE HCL ER 10 MG PO CP24: 10.0000 mg | ORAL_CAPSULE | Freq: Every morning | ORAL | 0 refills | Status: DC

## 2018-08-21 NOTE — Progress Notes (Signed)
Mason Owens [id number] [birth year] 23 y.o.  Subjective:   Patient ID:  Mason Owens is a 23 y.o. (DOB 11-08-1995) male.  Chief Complaint:  Chief Complaint  Patient presents with  . Follow-up    Medication Management  . ADHD    Medication Management  . Depression    Medication Management    Depression        Associated symptoms include decreased concentration.  Associated symptoms include no suicidal ideas.  Mason Owens presents to the office today for follow-up of above.  Last seen February 20, 2018.  No meds were changed.  Not crazy.  Lockdown with just he and girlfriend.  Work weekdays.  Sees her and his family occ.  Hasn't felt manic in a long time and misses the ups.  Circumstantially depressed.  GF is depressed and that doesn't help.  Not able to do much.  Tries to hike or skate but hard to push himself.  Going to beach.  July 4.    Focalin helps work function.  Overall pretty good re: mood and anxiety.  Still dating same girl for a year.  3 jobs. Residual depression and anxiety.  No crazy stuff bc has work to do.  Would be nice if he had 1 job and could be more routine.  No concerns about meds.  Having to adjust bc for a long time I wasn't doing anything and having to get used to be being busy.  Patient reports stable mood and denies depressed or irritable moods.  Patient denies any recent difficulty with anxiety.  Patient denies difficulty with sleep initiation or maintenance. Denies appetite disturbance.  Patient reports that energy and motivation have been good.  Patient denies any difficulty with concentration.  Patient denies any suicidal ideation.  Past Psychiatric Medication Trials: Depakote ER, Tegretol, Intuniv side effects, lamotrigine, perphenazine, Nuvigil, Wellbutrin, sertraline no response, lithium 1200 no response, Wellbutrin, Focalin, Latuda 120, Pristiq 50, perphenazine 12, Concerta crash Seen in this office since 2008  Review of Systems:  Review  of Systems  Musculoskeletal: Positive for back pain.  Psychiatric/Behavioral: Positive for decreased concentration, depression and dysphoric mood. Negative for agitation, behavioral problems, confusion, hallucinations, self-injury, sleep disturbance and suicidal ideas. The patient is nervous/anxious. The patient is not hyperactive.     Medications: I have reviewed the patient's current medications.  Current Outpatient Medications  Medication Sig Dispense Refill  . buPROPion (WELLBUTRIN XL) 150 MG 24 hr tablet TAKE 2 TABLETS (300 MG TOTAL) BY MOUTH DAILY. 180 tablet 0  . dexmethylphenidate (FOCALIN XR) 10 MG 24 hr capsule Take 1 capsule (10 mg total) by mouth every morning. 30 capsule 0  . dexmethylphenidate (FOCALIN XR) 10 MG 24 hr capsule Take 1 capsule (10 mg total) by mouth daily. 30 capsule 0  . dexmethylphenidate (FOCALIN XR) 10 MG 24 hr capsule Take 1 capsule (10 mg total) by mouth daily. 30 capsule 0  . divalproex (DEPAKOTE) 500 MG DR tablet TAKE 3 TABLETS (1,500 MG TOTAL) BY MOUTH DAILY. 90 tablet 3  . LATUDA 120 MG TABS TAKE 1 TABLET BY MOUTH EVERY DAY 30 tablet 0   No current facility-administered medications for this visit.     Medication Side Effects: None  Allergies: No Known Allergies  Past Medical History:  Diagnosis Date  . Bipolar disorder ([facility])     Family History  Problem Relation Age of Onset  . ADD / ADHD Father   . Depression Father   . Anxiety disorder Brother   .  Depression Maternal Grandmother   . Anxiety disorder Maternal Grandmother     Social History   Socioeconomic History  . Marital status: Single    Spouse name: Not on file  . Number of children: Not on file  . Years of education: Not on file  . Highest education level: Not on file  Occupational History  . Not on file  Social Needs  . Financial resource strain: Not on file  . Food insecurity    Worry: Not on file    Inability: Not on file  . Transportation needs    Medical: Not on  file    Non-medical: Not on file  Tobacco Use  . Smoking status: Current Every Day Smoker    Packs/day: 0.50    Types: Cigarettes  . Smokeless tobacco: Never Used  Substance and Sexual Activity  . Alcohol use: Yes    Alcohol/week: 4.0 - 5.0 standard drinks    Types: 4 - 5 Cans of beer per week    Comment: all types of alcohol drinks  . Drug use: Yes    Types: Marijuana    Comment: marijuana daily  . Sexual activity: Yes    Birth control/protection: Condom  Lifestyle  . Physical activity    Days per week: Not on file    Minutes per session: Not on file  . Stress: Not on file  Relationships  . Social Musicianconnections    Talks on phone: Not on file    Gets together: Not on file    Attends religious service: Not on file    Active member of club or organization: Not on file    Attends meetings of clubs or organizations: Not on file    Relationship status: Not on file  . Intimate partner violence    Fear of current or ex partner: Not on file    Emotionally abused: Not on file    Physically abused: Not on file    Forced sexual activity: Not on file  Other Topics Concern  . Not on file  Social History Narrative  . Not on file    Past Medical History, Surgical history, Social history, and Family history were reviewed and updated as appropriate.   Please see review of systems for further details on the patient's review from today.   Objective:   Physical Exam:  There were no vitals taken for this visit.  Physical Exam Constitutional:      General: He is not in acute distress.    Appearance: He is well-developed and normal weight.  Musculoskeletal:        General: No deformity.  Neurological:     Mental Status: He is alert and oriented to person, place, and time.     Motor: No tremor.     Coordination: Coordination normal.     Gait: Gait normal.  Psychiatric:        Attention and Perception: Attention normal. He is attentive.        Mood and Affect: Mood is depressed.  Mood is not anxious. Affect is not labile, blunt, angry or inappropriate.        Speech: Speech normal.        Behavior: Behavior normal.        Thought Content: Thought content normal. Thought content does not include homicidal or suicidal ideation. Thought content does not include homicidal or suicidal plan.        Cognition and Memory: Cognition normal.  Judgment: Judgment normal.     Comments: Insight intact. No auditory or visual hallucinations. No delusions.  Mild situational depression     Lab Review:     Component Value Date/Time   NA 141 07/06/2014 1458   K 4.2 07/06/2014 1458   CL 104 07/06/2014 1458   CO2 27 07/06/2014 1458   GLUCOSE 81 07/06/2014 1458   BUN 12 07/06/2014 1458   CREATININE 0.70 07/06/2014 1458   CALCIUM 9.4 07/06/2014 1458   PROT 7.2 07/06/2014 1458   ALBUMIN 4.7 07/06/2014 1458   AST 18 07/06/2014 1458   ALT 8 07/06/2014 1458   ALKPHOS 44 07/06/2014 1458   BILITOT 0.5 07/06/2014 1458       Component Value Date/Time   WBC 4.4 07/06/2014 1458   RBC 4.85 07/06/2014 1458   HGB 14.7 07/06/2014 1458   HCT 44.2 07/06/2014 1458   PLT 173 07/06/2014 1458   MCV 91.1 07/06/2014 1458   MCH 30.3 07/06/2014 1458   MCHC 33.3 07/06/2014 1458   RDW 14.3 07/06/2014 1458    No results found for: POCLITH, LITHIUM   No results found for: PHENYTOIN, PHENOBARB, VALPROATE, CBMZ   .res Assessment: Plan:    Mason Owens was seen today for follow-up, adhd and depression.  Diagnoses and all orders for this visit:  Bipolar disorder, in partial remission, most recent episode depressed (HCC)  Attention deficit hyperactivity disorder (ADHD), predominantly inattentive type   Relatively stable with benefit from meds.  Disc diet and appetite reduction with the meds and need for food with Latuda and types of diet in response to his questions.  Discussed potential metabolic side effects associated with atypical antipsychotics, as well as potential risk for  movement side effects. Advised pt to contact office if movement side effects occur.   Discussed potential benefits, risks, and side effects of stimulants with patient to include increased heart rate, palpitations, insomnia, increased anxiety, increased irritability, or decreased appetite.  Instructed patient to contact office if experiencing any significant tolerability issues.  He is aware to avoid THC in daytime with meds.  This appointment was 15 minutes  Fu 4 mo  Mason Staggersarey Cottle, MD, DFAPA  Please see After Visit Summary for patient specific instructions.  No future appointments.  No orders of the defined types were placed in this encounter.     -------------------------------

## 2018-08-21 NOTE — Patient Instructions (Signed)
Vitamin D 4000 units daily or more  Zinc 50 mg twice daily with food

## 2018-08-25 ENCOUNTER — Telehealth: Payer: Self-pay | Admitting: Psychiatry

## 2018-08-25 NOTE — Telephone Encounter (Signed)
Pt mom phoned stating [facility]'s Mason Owens is being rejected by insurance. She states he only has one week left. Insurance has stated provider changed formulary. Please call.

## 2018-08-25 NOTE — Telephone Encounter (Signed)
Last month pt paid $0 on 07/25/2018 for latuda 120 mg, this month insurance states "plan exceeds dollar amount" pharmacy isn't sure what that means. Pharmacy and nurse will contact [employer] to see if there are options.

## 2018-08-27 NOTE — Telephone Encounter (Signed)
Left voicemail with detailed information, there are also copay/discount cards in office to use

## 2018-08-27 NOTE — Telephone Encounter (Signed)
[name] called to check status of Latuda.

## 2018-08-27 NOTE — Telephone Encounter (Signed)
Last month they used a copay card which brought the cost to $0 and the card must have expired. They recommend pt get on [email or web address] to get a new card to help with cost. Pharmacist will also call [employer] to see if there is anything else that needs to be done.

## 2018-09-18 ENCOUNTER — Other Ambulatory Visit: Payer: Self-pay | Admitting: Psychiatry

## 2018-09-18 NOTE — Telephone Encounter (Signed)
Is the patient still taking? I didn't see it discontinued.

## 2018-09-18 NOTE — Telephone Encounter (Signed)
He does take it.

## 2018-09-19 ENCOUNTER — Other Ambulatory Visit: Payer: Self-pay | Admitting: Psychiatry

## 2018-09-23 ENCOUNTER — Other Ambulatory Visit: Payer: Self-pay | Admitting: Psychiatry

## 2018-09-30 ENCOUNTER — Telehealth: Payer: Self-pay | Admitting: Psychiatry

## 2018-09-30 NOTE — Telephone Encounter (Signed)
Mom [name] states last month insurance was not paying for [name], [name] [name] indicated that insurance is not paying for [name] wants to know if whatever took place last month needs to take place again so [name] can get the [name]

## 2018-10-01 NOTE — Telephone Encounter (Signed)
Pt states they did not have a discount coupon/card for Latuda. Mailed out to patient to take to pharmacy

## 2018-10-02 ENCOUNTER — Telehealth: Payer: Self-pay

## 2018-10-02 ENCOUNTER — Telehealth: Payer: Self-pay | Admitting: Psychiatry

## 2018-10-02 NOTE — Telephone Encounter (Signed)
Mom called requesting a Prior Authorization, she stated there had been a change in pt's formulary rx's.  Previously Latuda 120 mg was a formulary with his [employer] plan but effective 09/03/2018 that changed.   Prior authorization submitted through [employer] for Latuda 120 mg approved effective 10/02/2018-10/02/2019  [facility] as well.

## 2018-10-02 NOTE — Telephone Encounter (Signed)
Error

## 2018-12-14 ENCOUNTER — Other Ambulatory Visit: Payer: Self-pay | Admitting: Psychiatry

## 2018-12-15 ENCOUNTER — Telehealth: Payer: Self-pay | Admitting: Psychiatry

## 2018-12-15 ENCOUNTER — Other Ambulatory Visit: Payer: Self-pay

## 2018-12-15 DIAGNOSIS — F900 Attention-deficit hyperactivity disorder, predominantly inattentive type: Secondary | ICD-10-CM

## 2018-12-15 NOTE — Telephone Encounter (Signed)
Pended refill to be sent to [facility]

## 2018-12-15 NOTE — Telephone Encounter (Signed)
Ethin called to request refill of his Focalin.  Next Appt 10/22.  He has moved to [facility] so [facility] [address] [place]  He will keep his appt next week.  He will see if he can get the Depakote prescription just submitted transferred from [facility] to [facility].

## 2018-12-16 MED ORDER — DEXMETHYLPHENIDATE HCL ER 10 MG PO CP24: 10.0000 mg | ORAL_CAPSULE | Freq: Every day | ORAL | 0 refills | Status: DC

## 2018-12-17 ENCOUNTER — Telehealth: Payer: Self-pay

## 2018-12-17 NOTE — Telephone Encounter (Signed)
Prior authorization submitted and approved for Dexmethylphenidate ER 10 mg capsules effective 12/16/2018-12/16/2019 through [facility]

## 2018-12-25 ENCOUNTER — Other Ambulatory Visit: Payer: Self-pay

## 2018-12-25 ENCOUNTER — Ambulatory Visit (INDEPENDENT_AMBULATORY_CARE_PROVIDER_SITE_OTHER): Payer: No Typology Code available for payment source | Admitting: Psychiatry

## 2018-12-25 ENCOUNTER — Encounter: Payer: Self-pay | Admitting: Psychiatry

## 2018-12-25 VITALS — BP 104/78 | HR 84

## 2018-12-25 DIAGNOSIS — F900 Attention-deficit hyperactivity disorder, predominantly inattentive type: Secondary | ICD-10-CM

## 2018-12-25 DIAGNOSIS — F3162 Bipolar disorder, current episode mixed, moderate: Secondary | ICD-10-CM

## 2018-12-25 MED ORDER — DIVALPROEX SODIUM 500 MG PO DR TAB: 2000.0000 mg | DELAYED_RELEASE_TABLET | Freq: Every day | ORAL | 1 refills | Status: DC

## 2018-12-25 NOTE — Progress Notes (Signed)
Mason Owens [id number] 31-May-1995 23 y.o.   Subjective:   Patient ID:  Mason Owens is a 23 y.o. (DOB [birth year]) male.  Chief Complaint:  Chief Complaint  Patient presents with  . Follow-up    Medication Management  . Depression    Medication Management  . ADHD    Medication Management    Depression        Associated symptoms include decreased concentration.  Associated symptoms include no suicidal ideas.  Mason Owens presents to the office today for follow-up of above.  Last seen August 21, 2018.  No meds were changed.  Moved to [place] [employer] [place].  Life events happened.  Moved in with Mason Owens of 2y.  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  Got into social media drama that caused loss of friendships that caused problems.  Was traumatic.  Affected GF too so wanted to move out of town August. Started hard physically intense job and got to the end of himself.  Caused a crying breakdown.  Still feels out of control mentally.  Quit the job 2 weeks ago.  Not doing healthy things right now.  Ruminating thoughts with a lot of time. Anxiety throwing him into mood swings.  Jumping from one thing to another.  Smokes weed a bit too much as a coping mechanism.  Nothing to do and gets stoned and then more negative thinking.    Focalin helps work function.  Overall pretty good re: mood and anxiety.  Still dating same girl for a year.  3 jobs. Residual depression and anxiety.  No crazy stuff bc has work to do.  Would be nice if he had 1 job and could be more routine.  No concerns about meds.  Having to adjust bc for a long time I wasn't doing anything and having to get used to be being busy.  Patient reports stable mood and denies depressed or irritable moods.  Patient denies any recent difficulty with anxiety.  Patient denies difficulty with sleep initiation or maintenance. Denies appetite disturbance.  Patient reports that energy and motivation have been good.  Patient  denies any difficulty with concentration.  Patient denies any suicidal ideation.  Past Psychiatric Medication Trials: Depakote ER, Tegretol,  lamotrigine, lithium 1200 no response, Latuda 120, perphenazine 12 , Wellbutrin, sertraline no response, Pristiq 50,  Focalin, , Concerta crash, Nuvigil, Intuniv side effects Seen in this office since 2008  Review of Systems:  Review of Systems  Musculoskeletal: Negative for back pain.  Psychiatric/Behavioral: Positive for agitation, decreased concentration, depression and dysphoric mood. Negative for behavioral problems, confusion, hallucinations, self-injury, sleep disturbance and suicidal ideas. The patient is nervous/anxious. The patient is not hyperactive.     Medications: I have reviewed the patient's current medications.  Current Outpatient Medications  Medication Sig Dispense Refill  . buPROPion (WELLBUTRIN XL) 150 MG 24 hr tablet TAKE 2 TABLETS (300 MG TOTAL) BY MOUTH DAILY. 180 tablet 0  . dexmethylphenidate (FOCALIN XR) 10 MG 24 hr capsule Take 1 capsule (10 mg total) by mouth every morning. 30 capsule 0  . dexmethylphenidate (FOCALIN XR) 10 MG 24 hr capsule Take 1 capsule (10 mg total) by mouth daily. 30 capsule 0  . dexmethylphenidate (FOCALIN XR) 10 MG 24 hr capsule Take 1 capsule (10 mg total) by mouth daily. 30 capsule 0  . divalproex (DEPAKOTE ER) 500 MG 24 hr tablet TAKE 3 TABLETS (1,500 MG TOTAL) BY MOUTH AT BEDTIME. 270 tablet 0  . divalproex (  DEPAKOTE) 500 MG DR tablet TAKE 3 TABLETS (1,500 MG TOTAL) BY MOUTH DAILY. 90 tablet 3  . LATUDA 120 MG TABS TAKE 1 TABLET BY MOUTH EVERY DAY. 30 tablet 3   No current facility-administered medications for this visit.     Medication Side Effects: None  Allergies: No Known Allergies  Past Medical History:  Diagnosis Date  . Bipolar disorder (HCC)     Family History  Problem Relation Age of Onset  . ADD / ADHD Father   . Depression Father   . Anxiety disorder Brother   .  Depression Maternal Grandmother   . Anxiety disorder Maternal Grandmother     Social History   Socioeconomic History  . Marital status: Single    Spouse name: Not on file  . Number of children: Not on file  . Years of education: Not on file  . Highest education level: Not on file  Occupational History  . Not on file  Social Needs  . Financial resource strain: Not on file  . Food insecurity    Worry: Not on file    Inability: Not on file  . Transportation needs    Medical: Not on file    Non-medical: Not on file  Tobacco Use  . Smoking status: Current Every Day Smoker    Packs/day: 0.50    Types: Cigarettes  . Smokeless tobacco: Never Used  Substance and Sexual Activity  . Alcohol use: Yes    Alcohol/week: 4.0 - 5.0 standard drinks    Types: 4 - 5 Cans of beer per week    Comment: all types of alcohol drinks  . Drug use: Yes    Types: Marijuana    Comment: marijuana daily  . Sexual activity: Yes    Birth control/protection: Condom  Lifestyle  . Physical activity    Days per week: Not on file    Minutes per session: Not on file  . Stress: Not on file  Relationships  . Social Musician on phone: Not on file    Gets together: Not on file    Attends religious service: Not on file    Active member of club or organization: Not on file    Attends meetings of clubs or organizations: Not on file    Relationship status: Not on file  . Intimate partner violence    Fear of current or ex partner: Not on file    Emotionally abused: Not on file    Physically abused: Not on file    Forced sexual activity: Not on file  Other Topics Concern  . Not on file  Social History Narrative  . Not on file    Past Medical History, Surgical history, Social history, and Family history were reviewed and updated as appropriate.   Please see review of systems for further details on the patient's review from today.   Objective:   Physical Exam:  BP 104/78   Pulse 84    Physical Exam Neurological:     Mental Status: He is alert and oriented to person, place, and time.     Cranial Nerves: No dysarthria.  Psychiatric:        Attention and Perception: Attention normal.        Mood and Affect: Mood is anxious and depressed.        Speech: Speech normal.        Behavior: Behavior is cooperative.        Thought Content: Thought content  normal. Thought content is not paranoid or delusional. Thought content does not include homicidal or suicidal ideation. Thought content does not include homicidal or suicidal plan.        Cognition and Memory: Cognition and memory normal.     Comments: Fair insight and judgment. Increase mixed dysphoric manic agitation and racing thoughts.     Lab Review:     Component Value Date/Time   NA 141 07/06/2014 1458   K 4.2 07/06/2014 1458   CL 104 07/06/2014 1458   CO2 27 07/06/2014 1458   GLUCOSE 81 07/06/2014 1458   BUN 12 07/06/2014 1458   CREATININE 0.70 07/06/2014 1458   CALCIUM 9.4 07/06/2014 1458   PROT 7.2 07/06/2014 1458   ALBUMIN 4.7 07/06/2014 1458   AST 18 07/06/2014 1458   ALT 8 07/06/2014 1458   ALKPHOS 44 07/06/2014 1458   BILITOT 0.5 07/06/2014 1458       Component Value Date/Time   WBC 4.4 07/06/2014 1458   RBC 4.85 07/06/2014 1458   HGB 14.7 07/06/2014 1458   HCT 44.2 07/06/2014 1458   PLT 173 07/06/2014 1458   MCV 91.1 07/06/2014 1458   MCH 30.3 07/06/2014 1458   MCHC 33.3 07/06/2014 1458   RDW 14.3 07/06/2014 1458    No results found for: POCLITH, LITHIUM   No results found for: PHENYTOIN, PHENOBARB, VALPROATE, CBMZ   .res Assessment: Plan:    Mason Owens was seen today for follow-up, depression and adhd.  Diagnoses and all orders for this visit:  Bipolar 1 disorder, mixed, moderate (HCC)  Attention deficit hyperactivity disorder (ADHD), predominantly inattentive type  Greater than 50% of 25 minutes face to face time with patient was spent on counseling and coordination of care. We  discussed Patient has had a relapse of bipolar disorder with mixed symptoms.  This was described to him.  Disc diet and appetite reduction with the meds and need for food with Latuda and types of diet in response to his questions. Make sure take it with food.   Otherwise it will be less effective.  Discussed potential metabolic side effects associated with atypical antipsychotics, as well as potential risk for movement side effects. Advised pt to contact office if movement side effects occur.   Discussed potential benefits, risks, and side effects of stimulants with patient to include increased heart rate, palpitations, insomnia, increased anxiety, increased irritability, or decreased appetite.  Instructed patient to contact office if experiencing any significant tolerability issues.  Not taking stimulant much now.  Best to hold stimulant until mixed manic symptoms resolved.  He is aware to avoid THC in daytime with meds.  He admits he is smoking too much in the daytime and that that is probably not helpful.  He is trying to cut it back.  Increase Depakote to 2000 mg daily for better mood stability.  Discussed side effects in detail.  Apparently he is never been on more than 1500 mg daily.  Explained the fairly wide dosing range options.  Fu 4 weeks  Mason Staggersarey Cottle, MD, DFAPA  Please see After Visit Summary for patient specific instructions.  No future appointments.  No orders of the defined types were placed in this encounter.     -------------------------------

## 2019-01-06 ENCOUNTER — Telehealth: Payer: Self-pay | Admitting: Psychiatry

## 2019-01-06 NOTE — Telephone Encounter (Signed)
Pt left message requesting refill for Focalin @ [facility]. appt 11/13

## 2019-01-06 NOTE — Telephone Encounter (Signed)
Patient already has refill on file in [place]

## 2019-01-07 NOTE — Telephone Encounter (Signed)
Pharmacy stated they do have one on file and it is ready for him to pick up. I left him a VM letting him know he could pick it up.

## 2019-01-08 ENCOUNTER — Other Ambulatory Visit: Payer: Self-pay | Admitting: Psychiatry

## 2019-01-08 DIAGNOSIS — F3162 Bipolar disorder, current episode mixed, moderate: Secondary | ICD-10-CM

## 2019-01-16 ENCOUNTER — Encounter: Payer: Self-pay | Admitting: Psychiatry

## 2019-01-16 ENCOUNTER — Ambulatory Visit (INDEPENDENT_AMBULATORY_CARE_PROVIDER_SITE_OTHER): Payer: No Typology Code available for payment source | Admitting: Psychiatry

## 2019-01-16 ENCOUNTER — Other Ambulatory Visit: Payer: Self-pay

## 2019-01-16 DIAGNOSIS — F3162 Bipolar disorder, current episode mixed, moderate: Secondary | ICD-10-CM | POA: Diagnosis not present

## 2019-01-16 DIAGNOSIS — Z79899 Other long term (current) drug therapy: Secondary | ICD-10-CM | POA: Diagnosis not present

## 2019-01-16 DIAGNOSIS — F900 Attention-deficit hyperactivity disorder, predominantly inattentive type: Secondary | ICD-10-CM | POA: Diagnosis not present

## 2019-01-16 NOTE — Progress Notes (Signed)
Mason Owens [id number] October 26, 1995 22 y.o.   Virtual Visit via Telephone Note  I connected with pt by telephone and verified that I am speaking with the correct person using two identifiers.   I discussed the limitations, risks, security and privacy concerns of performing an evaluation and management service by telephone and the availability of in person appointments. I also discussed with the patient that there may be a patient responsible charge related to this service. The patient expressed understanding and agreed to proceed.  I discussed the assessment and treatment plan with the patient. The patient was provided an opportunity to ask questions and all were answered. The patient agreed with the plan and demonstrated an understanding of the instructions.   The patient was advised to call back or seek an in-person evaluation if the symptoms worsen or if the condition fails to improve as anticipated.  I provided 25 minutes of non-face-to-face time during this encounter. The call started at 1 of and ended at 120. The patient [place] and the provider was located office.   Subjective:   Patient ID:  Mason Owens is a 23 y.o. (DOB October 26, 1995) male.  Chief Complaint:  Chief Complaint  Patient presents with  . Follow-up    Medication Management  . Depression    Medication Management  . ADHD    Medication Management  . Other    Biploar 1  . Anxiety    Depression        Associated symptoms include decreased concentration.  Associated symptoms include no suicidal ideas.  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  Last seen December 25, 2018.  Depakote was increased from 1500 to 2000 mg daily because of mixed symptoms.  Work in appt to evaluate outcome of the change.  GF noticed no change.  He feels a little more control some days but not daily.  A lot of days very labile.  This week in a funk and procrastinating.  Yesterday  easily overwhelmed by simple things.  Feels he needs to  Continue emotional growth in therapy also.  [place]  Life events happened.  Moved in with Nilsa NuttingMargaret GF of 2y.  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   Started hard physically intense job and got to the end of himself.  Still feels out of control mentally.  Quit the job 2 weeks ago.  Not doing healthy things right now.  Ruminating thoughts with a lot of time. Anxiety throwing him into mood swings.  Jumping from one thing to another.  Easily overwhelmed emotionally.  Smokes weed a bit too much as a coping mechanism.  Nothing to do and gets stoned and then more negative thinking.    Focalin helps work function.   No concerns about meds.   Patient denies difficulty with sleep initiation or maintenance. Denies appetite disturbance.  Patient reports that energy and motivation have been good.  Concentration impaired when emotionally upset.  Periods of agitation both internally and behaviorally.  Patient denies any suicidal ideation.  Past Psychiatric Medication Trials: Depakote ER, Tegretol,  lamotrigine, lithium 1200 no response, Latuda 120, perphenazine 12 Wellbutrin, sertraline no response, Pristiq 50,  Focalin, , Concerta crash, Nuvigil, Intuniv side effects Seen in this office since 2008  Review of Systems:  Review of Systems  Musculoskeletal: Negative for back pain.  Neurological: Negative for dizziness.  Psychiatric/Behavioral: Positive for agitation, decreased concentration, depression and dysphoric  mood. Negative for behavioral problems, confusion, hallucinations, self-injury, sleep disturbance and suicidal ideas. The patient is nervous/anxious. The patient is not hyperactive.     Medications: I have reviewed the patient's current medications.  Current Outpatient Medications  Medication Sig Dispense Refill  . buPROPion (WELLBUTRIN XL) 150 MG 24 hr tablet TAKE 2 TABLETS (300 MG TOTAL) BY MOUTH DAILY. 180  tablet 0  . dexmethylphenidate (FOCALIN XR) 10 MG 24 hr capsule Take 1 capsule (10 mg total) by mouth every morning. 30 capsule 0  . dexmethylphenidate (FOCALIN XR) 10 MG 24 hr capsule Take 1 capsule (10 mg total) by mouth daily. 30 capsule 0  . dexmethylphenidate (FOCALIN XR) 10 MG 24 hr capsule Take 1 capsule (10 mg total) by mouth daily. 30 capsule 0  . divalproex (DEPAKOTE) 500 MG DR tablet TAKE 4 TABLETS (2,000 MG TOTAL) BY MOUTH AT BEDTIME. 360 tablet 1  . LATUDA 120 MG TABS TAKE 1 TABLET BY MOUTH EVERY DAY. 30 tablet 3   No current facility-administered medications for this visit.     Medication Side Effects: None  Allergies: No Known Allergies  Past Medical History:  Diagnosis Date  . Bipolar disorder (HCC)     Family History  Problem Relation Age of Onset  . ADD / ADHD Father   . Depression Father   . Anxiety disorder Brother   . Depression Maternal Grandmother   . Anxiety disorder Maternal Grandmother     Social History   Socioeconomic History  . Marital status: Single    Spouse name: Not on file  . Number of children: Not on file  . Years of education: Not on file  . Highest education level: Not on file  Occupational History  . Not on file  Social Needs  . Financial resource strain: Not on file  . Food insecurity    Worry: Not on file    Inability: Not on file  . Transportation needs    Medical: Not on file    Non-medical: Not on file  Tobacco Use  . Smoking status: Current Every Day Smoker    Packs/day: 0.50    Types: Cigarettes  . Smokeless tobacco: Never Used  Substance and Sexual Activity  . Alcohol use: Yes    Alcohol/week: 4.0 - 5.0 standard drinks    Types: 4 - 5 Cans of beer per week    Comment: all types of alcohol drinks  . Drug use: Yes    Types: Marijuana    Comment: marijuana daily  . Sexual activity: Yes    Birth control/protection: Condom  Lifestyle  . Physical activity    Days per week: Not on file    Minutes per session: Not  on file  . Stress: Not on file  Relationships  . Social Musician on phone: Not on file    Gets together: Not on file    Attends religious service: Not on file    Active member of club or organization: Not on file    Attends meetings of clubs or organizations: Not on file    Relationship status: Not on file  . Intimate partner violence    Fear of current or ex partner: Not on file    Emotionally abused: Not on file    Physically abused: Not on file    Forced sexual activity: Not on file  Other Topics Concern  . Not on file  Social History Narrative  . Not on file  Past Medical History, Surgical history, Social history, and Family history were reviewed and updated as appropriate.   Please see review of systems for further details on the patient's review from today.   Objective:   Physical Exam:  There were no vitals taken for this visit.  Physical Exam Neurological:     Mental Status: He is alert and oriented to person, place, and time.     Cranial Nerves: No dysarthria.  Psychiatric:        Attention and Perception: Attention normal.        Mood and Affect: Mood is anxious and depressed.        Speech: Speech normal.        Behavior: Behavior is cooperative.        Thought Content: Thought content normal. Thought content is not paranoid or delusional. Thought content does not include homicidal or suicidal ideation. Thought content does not include homicidal or suicidal plan.        Cognition and Memory: Cognition and memory normal.     Comments: Fair insight and judgment. Ongoing mixed dysphoric manic agitation and racing thoughts.     Lab Review:     Component Value Date/Time   NA 141 07/06/2014 1458   K 4.2 07/06/2014 1458   CL 104 07/06/2014 1458   CO2 27 07/06/2014 1458   GLUCOSE 81 07/06/2014 1458   BUN 12 07/06/2014 1458   CREATININE 0.70 07/06/2014 1458   CALCIUM 9.4 07/06/2014 1458   PROT 7.2 07/06/2014 1458   ALBUMIN 4.7 07/06/2014 1458    AST 18 07/06/2014 1458   ALT 8 07/06/2014 1458   ALKPHOS 44 07/06/2014 1458   BILITOT 0.5 07/06/2014 1458       Component Value Date/Time   WBC 4.4 07/06/2014 1458   RBC 4.85 07/06/2014 1458   HGB 14.7 07/06/2014 1458   HCT 44.2 07/06/2014 1458   PLT 173 07/06/2014 1458   MCV 91.1 07/06/2014 1458   MCH 30.3 07/06/2014 1458   MCHC 33.3 07/06/2014 1458   RDW 14.3 07/06/2014 1458    No results found for: POCLITH, LITHIUM   No results found for: PHENYTOIN, PHENOBARB, VALPROATE, CBMZ   .res Assessment: Plan:    Cato was seen today for follow-up, depression, adhd, other and anxiety.  Diagnoses and all orders for this visit:  Bipolar 1 disorder, mixed, moderate (HCC)  Attention deficit hyperactivity disorder (ADHD), predominantly inattentive type  Encounter for long-term (current) use of medications     Greater than 50% of 25 minutes face to face time with patient was spent on counseling and coordination of care. We discussed Patient has had a relapse of bipolar disorder with mixed symptoms.  This was described to him. There is been no significant change in symptoms externally but mild improvement in behavior-year-old control internally from the increase in Depakote to 2000 mg.  Overall the benefit to the increase has been slight.  He would like better control.  Another concern expressed was that he will lose his parents insurance and may lose insurance altogether when he is 65 in 1 year.  Wants to make med changes that are consistent with the possibility of losing insurance.  We discussed the possibility of patient assistance for Latuda if it comes to that situation.  The most cocked of cost effective strategy would be to optimize response to Depakote leading to the potential of maybe weaning off the [name]  Disc diet and appetite reduction with the meds and need for  food with Latuda and types of diet in response to his questions. Make sure take it with food.   Otherwise it  will be less effective.  Discussed potential metabolic side effects associated with atypical antipsychotics, as well as potential risk for movement side effects. Advised pt to contact office if movement side effects occur.   Discussed potential benefits, risks, and side effects of stimulants with patient to include increased heart rate, palpitations, insomnia, increased anxiety, increased irritability, or decreased appetite.  Instructed patient to contact office if experiencing any significant tolerability issues.  Not taking stimulant much now.  Best to hold stimulant until mixed manic symptoms resolved.  He is aware to avoid THC in daytime with meds.  He admits he is smoking too much in the daytime and that that is probably not helpful.  He is trying to cut it back.  Continue Depakote to 2000 mg daily  Discussed side effects in detail.   Explained the fairly wide dosing range options.  Check valproic acid level and CBC  Fu 6 weeks  Meredith Staggers, MD, DFAPA  Please see After Visit Summary for patient specific instructions.  No future appointments.  No orders of the defined types were placed in this encounter.     -------------------------------

## 2019-01-24 LAB — CBC
HCT: 43.3 % (ref 38.5–50.0)
Hemoglobin: 14.4 g/dL (ref 13.2–17.1)
MCH: 30.9 pg (ref 27.0–33.0)
MCHC: 33.3 g/dL (ref 32.0–36.0)
MCV: 92.9 fL (ref 80.0–100.0)
MPV: 13.1 fL — ABNORMAL HIGH (ref 7.5–12.5)
Platelets: 167 10*3/uL (ref 140–400)
RBC: 4.66 10*6/uL (ref 4.20–5.80)
RDW: 12.5 % (ref 11.0–15.0)
WBC: 4.7 10*3/uL (ref 3.8–10.8)

## 2019-01-24 LAB — VALPROIC ACID LEVEL: Valproic Acid Lvl: 119.8 mg/L — ABNORMAL HIGH (ref 50.0–100.0)

## 2019-01-28 NOTE — Progress Notes (Signed)
Pt. Made aware and verbalized understanding.

## 2019-02-17 ENCOUNTER — Telehealth: Payer: Self-pay | Admitting: Psychiatry

## 2019-02-17 NOTE — Telephone Encounter (Signed)
Fill the latuda [facility]

## 2019-02-20 ENCOUNTER — Other Ambulatory Visit: Payer: Self-pay | Admitting: Psychiatry

## 2019-02-20 NOTE — Telephone Encounter (Signed)
Rx's already submitted

## 2019-02-20 NOTE — Telephone Encounter (Signed)
Pt waiting on Latuda refill. Continues to call office. Thanks

## 2019-05-17 ENCOUNTER — Other Ambulatory Visit: Payer: Self-pay | Admitting: Psychiatry

## 2019-05-18 NOTE — Telephone Encounter (Signed)
Last apt 01/16/2019 was due back 6 weeks

## 2019-06-02 ENCOUNTER — Encounter: Payer: Self-pay | Admitting: Psychiatry

## 2019-06-02 ENCOUNTER — Ambulatory Visit (INDEPENDENT_AMBULATORY_CARE_PROVIDER_SITE_OTHER): Payer: No Typology Code available for payment source | Admitting: Psychiatry

## 2019-06-02 DIAGNOSIS — F900 Attention-deficit hyperactivity disorder, predominantly inattentive type: Secondary | ICD-10-CM | POA: Diagnosis not present

## 2019-06-02 DIAGNOSIS — F3162 Bipolar disorder, current episode mixed, moderate: Secondary | ICD-10-CM

## 2019-06-02 MED ORDER — CARIPRAZINE HCL 1.5 MG PO CAPS: 1.5000 mg | ORAL_CAPSULE | Freq: Every day | ORAL | 1 refills | Status: DC

## 2019-06-02 NOTE — Progress Notes (Signed)
Mason Owens [id number] September 10, 1995 24 y.o.   Virtual Visit via Telephone Note  I connected with pt by telephone and verified that I am speaking with the correct person using two identifiers.   I discussed the limitations, risks, security and privacy concerns of performing an evaluation and management service by telephone and the availability of in person appointments. I also discussed with the patient that there may be a patient responsible charge related to this service. The patient expressed understanding and agreed to proceed.  I discussed the assessment and treatment plan with the patient. The patient was provided an opportunity to ask questions and all were answered. The patient agreed with the plan and demonstrated an understanding of the instructions.   The patient was advised to call back or seek an in-person evaluation if the symptoms worsen or if the condition fails to improve as anticipated.  I provided 26 minutes of non-face-to-face time during this encounter. The call started at 130 of and ended at 200. The patient was located at home in [place] and the provider was located office.   Subjective:   Patient ID:  Mason Owens is a 24 y.o. (DOB Jan 26, 1996) male.  Chief Complaint:  Chief Complaint  Patient presents with  . Follow-up    mood disorder and meds  . Depression  . Anxiety    Depression        Associated symptoms include decreased concentration and myalgias.  Associated symptoms include no suicidal ideas.  Mason Owens presents to the office today for follow-up of above.  seen December 25, 2018.  Depakote was increased from 1500 to 2000 mg daily because of mixed symptoms.  Work in appt to evaluate outcome of the change.  GF noticed no change.  He feels a little more control some days but not daily.  A lot of days very labile.  This week in a funk and procrastinating.  Yesterday easily overwhelmed by simple things.  Feels he needs to  Continue emotional  growth in therapy also.  Last seen November 2020.  Valproic acid CBC checked and noted.  No med changes are made.  Started new job.  Focalin XR is too much with the new job.   Too hyper focused.  Honestly still a mess and not able to pick himself up.  Crappy drug and no motivation and just getting by.  Lately worse anxiety.  In his head too much and reacting to stress.   Isolated.  Coworker not a good influence.  Clouded thoughts with anxiety.   Moved to [place].  Life events happened.  Moved in with Mason Owens of 2y.  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   Started hard physically intense job and got to the end of himself.  Still feels out of control mentally.  Quit the job 2 weeks ago.  Not doing healthy things right now.  Ruminating thoughts with a lot of time. Anxiety throwing him into mood swings.  Jumping from one thing to another.  Easily overwhelmed emotionally.  Smokes weed a bit too much as a coping mechanism.  Nothing to do and gets stoned and then more negative thinking.    Focalin helps work function.   No concerns about meds.   Patient denies difficulty with sleep initiation or maintenance. Denies appetite disturbance.  Patient reports that energy and motivation have been good.  Concentration impaired when emotionally upset.  Periods of agitation both internally and behaviorally.  Patient denies any  suicidal ideation.  Past Psychiatric Medication Trials: Depakote ER, Tegretol,  lamotrigine, lithium 1200 no response, Latuda 120, perphenazine 12 Wellbutrin, sertraline no response, Pristiq 50,  Focalin, , Concerta crash, Nuvigil, Intuniv side effects Seen in this office since 2008  Review of Systems:  Review of Systems  Musculoskeletal: Positive for myalgias. Negative for back pain.  Neurological: Negative for dizziness.  Psychiatric/Behavioral: Positive for agitation, decreased concentration, depression and dysphoric mood. Negative for behavioral problems,  confusion, hallucinations, self-injury, sleep disturbance and suicidal ideas. The patient is nervous/anxious. The patient is not hyperactive.     Medications: I have reviewed the patient's current medications.  Current Outpatient Medications  Medication Sig Dispense Refill  . buPROPion (WELLBUTRIN XL) 150 MG 24 hr tablet TAKE 2 TABLETS (300 MG TOTAL) BY MOUTH DAILY. 180 tablet 0  . divalproex (DEPAKOTE) 500 MG DR tablet TAKE 4 TABLETS (2,000 MG TOTAL) BY MOUTH AT BEDTIME. 360 tablet 1  . cariprazine (VRAYLAR) capsule Take 1 capsule (1.5 mg total) by mouth daily. 30 capsule 1  . dexmethylphenidate (FOCALIN XR) 10 MG 24 hr capsule Take 1 capsule (10 mg total) by mouth every morning. (Patient not taking: Reported on 06/02/2019) 30 capsule 0  . dexmethylphenidate (FOCALIN XR) 10 MG 24 hr capsule Take 1 capsule (10 mg total) by mouth daily. (Patient not taking: Reported on 06/02/2019) 30 capsule 0  . dexmethylphenidate (FOCALIN XR) 10 MG 24 hr capsule Take 1 capsule (10 mg total) by mouth daily. (Patient not taking: Reported on 06/02/2019) 30 capsule 0   No current facility-administered medications for this visit.    Medication Side Effects: None  Allergies: No Known Allergies  Past Medical History:  Diagnosis Date  . Bipolar disorder ([place])     Family History  Problem Relation Age of Onset  . ADD / ADHD Father   . Depression Father   . Anxiety disorder Brother   . Depression Maternal Grandmother   . Anxiety disorder Maternal Grandmother     Social History   Socioeconomic History  . Marital status: Single    Spouse name: Not on file  . Number of children: Not on file  . Years of education: Not on file  . Highest education level: Not on file  Occupational History  . Not on file  Tobacco Use  . Smoking status: Current Every Day Smoker    Packs/day: 0.50    Types: Cigarettes  . Smokeless tobacco: Never Used  Substance and Sexual Activity  . Alcohol use: Yes    Alcohol/week:  4.0 - 5.0 standard drinks    Types: 4 - 5 Cans of beer per week    Comment: all types of alcohol drinks  . Drug use: Yes    Types: Marijuana    Comment: marijuana daily  . Sexual activity: Yes    Birth control/protection: Condom  Other Topics Concern  . Not on file  Social History Narrative  . Not on file   Social Determinants of Health   Financial Resource Strain:   . Difficulty of Paying Living Expenses:   Food Insecurity:   . Worried About Charity fundraiser in the Last Year:   . Arboriculturist in the Last Year:   Transportation Needs:   . Film/video editor (Medical):   Marland Kitchen Lack of Transportation (Non-Medical):   Physical Activity:   . Days of Exercise per Week:   . Minutes of Exercise per Session:   Stress:   . Feeling of Stress :  Social Connections:   . Frequency of Communication with Friends and Family:   . Frequency of Social Gatherings with Friends and Family:   . Attends Religious Services:   . Active Member of Clubs or Organizations:   . Attends Banker Meetings:   Marland Kitchen Marital Status:   Intimate Partner Violence:   . Fear of Current or Ex-Partner:   . Emotionally Abused:   Marland Kitchen Physically Abused:   . Sexually Abused:     Past Medical History, Surgical history, Social history, and Family history were reviewed and updated as appropriate.   Please see review of systems for further details on the patient's review from today.   Objective:   Physical Exam:  There were no vitals taken for this visit.  Physical Exam Neurological:     Mental Status: He is alert and oriented to person, place, and time.     Cranial Nerves: No dysarthria.  Psychiatric:        Attention and Perception: Attention normal.        Mood and Affect: Mood is anxious and depressed.        Speech: Speech normal.        Behavior: Behavior is cooperative.        Thought Content: Thought content normal. Thought content is not paranoid or delusional. Thought content does not  include homicidal or suicidal ideation. Thought content does not include homicidal or suicidal plan.        Cognition and Memory: Cognition and memory normal.     Comments: Fair insight and judgment. Ongoing mixed dysphoric manic agitation and racing thoughts.  For at least several months     Lab Review:     Component Value Date/Time   NA 141 07/06/2014 1458   K 4.2 07/06/2014 1458   CL 104 07/06/2014 1458   CO2 27 07/06/2014 1458   GLUCOSE 81 07/06/2014 1458   BUN 12 07/06/2014 1458   CREATININE 0.70 07/06/2014 1458   CALCIUM 9.4 07/06/2014 1458   PROT 7.2 07/06/2014 1458   ALBUMIN 4.7 07/06/2014 1458   AST 18 07/06/2014 1458   ALT 8 07/06/2014 1458   ALKPHOS 44 07/06/2014 1458   BILITOT 0.5 07/06/2014 1458       Component Value Date/Time   WBC 4.7 01/23/2019 0000   RBC 4.66 01/23/2019 0000   HGB 14.4 01/23/2019 0000   HCT 43.3 01/23/2019 0000   PLT 167 01/23/2019 0000   MCV 92.9 01/23/2019 0000   MCH 30.9 01/23/2019 0000   MCHC 33.3 01/23/2019 0000   RDW 12.5 01/23/2019 0000    No results found for: POCLITH, LITHIUM   Lab Results  Component Value Date   VALPROATE 119.8 (H) 01/23/2019     .res Assessment: Plan:    Mason Owens was seen today for follow-up, depression and anxiety.  Diagnoses and all orders for this visit:  Bipolar 1 disorder, mixed, moderate (HCC) -     cariprazine (VRAYLAR) capsule; Take 1 capsule (1.5 mg total) by mouth daily.  Attention deficit hyperactivity disorder (ADHD), predominantly inattentive type -     cariprazine (VRAYLAR) capsule; Take 1 capsule (1.5 mg total) by mouth daily.     Greater than 50% of 25 minutes face to face time with patient was spent on counseling and coordination of care. We discussed Patient has had a relapse of bipolar disorder with mixed symptoms.  This was described to him. There is been no significant change in symptoms externally but mild improvement in  behavior-year-old control internally from the increase  in Depakote to 2000 mg.  Overall the benefit to the increase has been slight.  He would like better control.  He continues to have mixed symptoms of bipolar disorder plus anxiety.  Another concern expressed was that he will lose his parents insurance and may lose insurance altogether when he is 58 in November 2021.    Discussed potential metabolic side effects associated with atypical antipsychotics, as well as potential risk for movement side effects. Advised pt to contact office if movement side effects occur.   He is aware to avoid THC in daytime with meds.  He admits he is smoking too much in the daytime and that that is probably not helpful.  He is trying to cut it back.  Continue Depakote to 2000 mg daily  Discussed side effects in detail.   Explained the fairly wide dosing range options.  Checked valproic acid level and CBC.  CBC unremarkable in November 2020.  Specifically platelet count 167 and normal WBC and hematocrit.  Valproic acid level slightly elevated at 120 which is considered acceptable in psychiatry.  Switch from Latuda to Vraylar 1.5 mg because of continued bipolar mixed symptoms.  Later consider aripiprazole if necessary when he goes off his parents insurance. Start Vraylar 1.5 mg daily Reduce Latuda to 60 mg daily for 1 week, then 30 mg daily for 1 week then stop it.  Call if there is any withdrawal symptoms or worsening symptoms.  Discussed the dosage range of Vraylar.  Fu 6 weeks  Meredith Staggers, MD, DFAPA  Please see After Visit Summary for patient specific instructions.  No future appointments.  No orders of the defined types were placed in this encounter.     -------------------------------

## 2019-06-03 ENCOUNTER — Other Ambulatory Visit: Payer: Self-pay

## 2019-06-03 ENCOUNTER — Telehealth: Payer: Self-pay

## 2019-06-03 ENCOUNTER — Telehealth: Payer: Self-pay | Admitting: Psychiatry

## 2019-06-03 MED ORDER — ARIPIPRAZOLE 10 MG PO TABS: 10.0000 mg | ORAL_TABLET | Freq: Every day | ORAL | 0 refills | Status: DC

## 2019-06-03 NOTE — Telephone Encounter (Signed)
Okay, Thanks

## 2019-06-03 NOTE — Telephone Encounter (Signed)
Mrs. Ackerley called regarding the changes in [place]'s medication. Dr. Jennelle Human was to change his medication from LATUDA to [facility] but since Isamar is about to be 24 he will no longer be on his parents Insurance and Mrs. Eliakim wants him to be able to afford and keep up with taking his medications. Is there anyway he maybe able to get something cheaper other than the VRAYLAR?

## 2019-06-03 NOTE — Telephone Encounter (Signed)
Prior authorization submitted and approved for VRAYLAR 1.5 MG CAPSULE effective 06/03/2019-06/02/2020, PA# [id number]. [employer] Rx ID# [id number]   Submitted through cover my meds

## 2019-06-03 NOTE — Telephone Encounter (Signed)
RX sent

## 2019-06-03 NOTE — Telephone Encounter (Signed)
Okay cancel the Vraylar order.  Prescribed Abilify 10 mg 1 daily.  #30 no refills

## 2019-06-03 NOTE — Telephone Encounter (Signed)
He should have received some samples and a co pay card. I know his Prior Authorization was approved. Will check with his pharmacy to check cost.

## 2019-06-04 NOTE — Telephone Encounter (Signed)
Mom has been notified  

## 2019-06-16 ENCOUNTER — Telehealth: Payer: Self-pay | Admitting: Psychiatry

## 2019-06-16 NOTE — Telephone Encounter (Signed)
Mason Owens called and has questions about his meds. He wants to know that since taking the ABILIFY 10 MG does he stop the LATUDA? He is requesting a call back.  [phone]

## 2019-06-16 NOTE — Telephone Encounter (Signed)
RTC to patient and left message with detailed information, advised him he was to taper off Latuda 60 mg X 1 week, then 30 mg X 1 week then discontinue while starting the Abilify. Advised to call back with further questions or concerns.

## 2019-06-22 ENCOUNTER — Other Ambulatory Visit: Payer: Self-pay | Admitting: Psychiatry

## 2019-06-30 ENCOUNTER — Other Ambulatory Visit: Payer: Self-pay | Admitting: Psychiatry

## 2019-07-15 ENCOUNTER — Other Ambulatory Visit: Payer: Self-pay | Admitting: Psychiatry

## 2019-07-15 ENCOUNTER — Telehealth: Payer: Self-pay | Admitting: Psychiatry

## 2019-07-15 DIAGNOSIS — F900 Attention-deficit hyperactivity disorder, predominantly inattentive type: Secondary | ICD-10-CM

## 2019-07-15 NOTE — Telephone Encounter (Signed)
Patient called and said that he needs a refill on his focalin xr 10 mg to be sent to the [facility] [address] in [place]. Next appt in may

## 2019-07-16 ENCOUNTER — Other Ambulatory Visit: Payer: Self-pay | Admitting: Psychiatry

## 2019-07-16 DIAGNOSIS — F3162 Bipolar disorder, current episode mixed, moderate: Secondary | ICD-10-CM

## 2019-07-30 ENCOUNTER — Encounter: Payer: Self-pay | Admitting: Psychiatry

## 2019-07-30 ENCOUNTER — Telehealth (INDEPENDENT_AMBULATORY_CARE_PROVIDER_SITE_OTHER): Payer: No Typology Code available for payment source | Admitting: Psychiatry

## 2019-07-30 DIAGNOSIS — M545 Low back pain, unspecified: Secondary | ICD-10-CM

## 2019-07-30 DIAGNOSIS — F411 Generalized anxiety disorder: Secondary | ICD-10-CM

## 2019-07-30 DIAGNOSIS — F900 Attention-deficit hyperactivity disorder, predominantly inattentive type: Secondary | ICD-10-CM

## 2019-07-30 DIAGNOSIS — F3162 Bipolar disorder, current episode mixed, moderate: Secondary | ICD-10-CM

## 2019-07-30 MED ORDER — ARIPIPRAZOLE 15 MG PO TABS: 15.0000 mg | ORAL_TABLET | Freq: Every day | ORAL | 2 refills | Status: DC

## 2019-07-30 MED ORDER — GABAPENTIN 300 MG PO CAPS: 300.0000 mg | ORAL_CAPSULE | Freq: Three times a day (TID) | ORAL | 1 refills | Status: DC

## 2019-07-30 MED ORDER — DEXMETHYLPHENIDATE HCL ER 10 MG PO CP24: 10.0000 mg | ORAL_CAPSULE | Freq: Every day | ORAL | 0 refills | Status: DC

## 2019-07-30 MED ORDER — DEXMETHYLPHENIDATE HCL ER 10 MG PO CP24: 10.0000 mg | ORAL_CAPSULE | Freq: Every morning | ORAL | 0 refills | Status: DC

## 2019-07-30 MED ORDER — BUPROPION HCL ER (XL) 300 MG PO TB24: 300.0000 mg | ORAL_TABLET | Freq: Every day | ORAL | 3 refills | Status: DC

## 2019-07-30 NOTE — Progress Notes (Signed)
Mason Owens [id number] 1995/12/06 24 y.o.   I connected with  Mason Owens on 07/30/19 by a video enabled telemedicine application and verified that I am speaking with the correct person using two identifiers.   I discussed the limitations of evaluation and management by telemedicine. The patient expressed understanding and agreed to proceed.    Subjective:   Patient ID:  Mason Owens is a 24 y.o. (DOB 05-29-1995) male.  Chief Complaint:  Chief Complaint  Patient presents with  . Follow-up  . Depression  . Anxiety  . ADHD  . Back Pain    Depression        Associated symptoms include decreased concentration and myalgias.  Associated symptoms include no suicidal ideas.  Mason Owens presents to the office today for follow-up of above.  seen December 25, 2018.  Depakote was increased from 1500 to 2000 mg daily because of mixed symptoms.  Work in appt to evaluate outcome of the change.  GF noticed no change.  He feels a little more control some days but not daily.  A lot of days very labile.  This week in a funk and procrastinating.  Yesterday easily overwhelmed by simple things.  Feels he needs to  Continue emotional growth in therapy also.  seen November 2020.  Valproic acid CBC checked and noted.  No med changes are made.  Last seen 06/02/2019 with the following noted: Honestly still a mess and not able to pick himself up.  Crappy drug and no motivation and just getting by.  Lately worse anxiety.  In his head too much and reacting to stress.   Isolated.  Coworker not a good influence.  Clouded thoughts with anxiety.  Moved to [employer].  Life events happened.  Moved in with Mason Owens of 2y.  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   Started hard physically intense job and got to the end of himself.  Still feels out of control mentally.  Quit the job 2 weeks ago.  Not doing healthy things right now.  Ruminating thoughts with a lot of time. Anxiety  throwing him into mood swings.  Jumping from one thing to another.  Easily overwhelmed emotionally. Smokes weed a bit too much as a coping mechanism.  Nothing to do and gets stoned and then more negative thinking.   Because of persistent symptoms the plan was to wean Latuda and start Vraylar 1.5 mg daily for bipolar depression.  However insurance would not cover it so we switched to Abilify 10 mg daily.  There was also concern that the patient will eventually lose his insurance and he needed to be on a generic  07/30/19 with the following noted: No change with Abilify much.  Life in general sucks.  He thinks that may be a drag on his mood as well.  Things are not working out in [place] as he had hoped. Started a new job and still needs his Focalin but is out.  He has been compliant with medications.  No side effects with Abilify.  Sleeping all right except complaining of some back pain and asked if there is anything I could do to help with that.   No concerns about meds.   Patient denies difficulty with sleep initiation or maintenance. Denies appetite disturbance.  Patient reports that energy and motivation have been good.  Concentration impaired when emotionally upset.  Periods of agitation both internally and behaviorally.  Patient denies any suicidal ideation.  Past  Psychiatric Medication Trials: Depakote ER, Tegretol,  lamotrigine, lithium 1200 no response, Latuda 120, perphenazine 12, Abilify 10 Wellbutrin, sertraline no response, Pristiq 50,  Focalin, , Concerta crash, Nuvigil, Intuniv side effects Seen in this office since 2008  Review of Systems:  Review of Systems  Musculoskeletal: Positive for back pain and myalgias.  Neurological: Negative for dizziness.  Psychiatric/Behavioral: Positive for agitation, decreased concentration, depression and dysphoric mood. Negative for behavioral problems, confusion, hallucinations, self-injury, sleep disturbance and suicidal ideas. The patient is  nervous/anxious. The patient is not hyperactive.     Medications: I have reviewed the patient's current medications.  Current Outpatient Medications  Medication Sig Dispense Refill  . ARIPiprazole (ABILIFY) 15 MG tablet Take 1 tablet (15 mg total) by mouth daily. 30 tablet 2  . buPROPion (WELLBUTRIN XL) 300 MG 24 hr tablet Take 1 tablet (300 mg total) by mouth daily. 30 tablet 3  . dexmethylphenidate (FOCALIN XR) 10 MG 24 hr capsule Take 1 capsule (10 mg total) by mouth every morning. 30 capsule 0  . [START ON 08/27/2019] dexmethylphenidate (FOCALIN XR) 10 MG 24 hr capsule Take 1 capsule (10 mg total) by mouth daily. 30 capsule 0  . [START ON 09/24/2019] dexmethylphenidate (FOCALIN XR) 10 MG 24 hr capsule Take 1 capsule (10 mg total) by mouth daily. 30 capsule 0  . divalproex (DEPAKOTE) 500 MG DR tablet TAKE 4 TABLETS (2,000 MG TOTAL) BY MOUTH AT BEDTIME. 360 tablet 1  . gabapentin (NEURONTIN) 300 MG capsule Take 1 capsule (300 mg total) by mouth 3 (three) times daily. 90 capsule 1   No current facility-administered medications for this visit.    Medication Side Effects: None  Allergies: No Known Allergies  Past Medical History:  Diagnosis Date  . Bipolar disorder (HCC)     Family History  Problem Relation Age of Onset  . ADD / ADHD Father   . Depression Father   . Anxiety disorder Brother   . Depression Maternal Grandmother   . Anxiety disorder Maternal Grandmother     Social History   Socioeconomic History  . Marital status: Single    Spouse name: Not on file  . Number of children: Not on file  . Years of education: Not on file  . Highest education level: Not on file  Occupational History  . Not on file  Tobacco Use  . Smoking status: Current Every Day Smoker    Packs/day: 0.50    Types: Cigarettes  . Smokeless tobacco: Never Used  Substance and Sexual Activity  . Alcohol use: Yes    Alcohol/week: 4.0 - 5.0 standard drinks    Types: 4 - 5 Cans of beer per week     Comment: all types of alcohol drinks  . Drug use: Yes    Types: Marijuana    Comment: marijuana daily  . Sexual activity: Yes    Birth control/protection: Condom  Other Topics Concern  . Not on file  Social History Narrative  . Not on file   Social Determinants of Health   Financial Resource Strain:   . Difficulty of Paying Living Expenses:   Food Insecurity:   . Worried About Programme researcher, broadcasting/film/video in the Last Year:   . Barista in the Last Year:   Transportation Needs:   . Freight forwarder (Medical):   Marland Kitchen Lack of Transportation (Non-Medical):   Physical Activity:   . Days of Exercise per Week:   . Minutes of Exercise per Session:  Stress:   . Feeling of Stress :   Social Connections:   . Frequency of Communication with Friends and Family:   . Frequency of Social Gatherings with Friends and Family:   . Attends Religious Services:   . Active Member of Clubs or Organizations:   . Attends Banker Meetings:   Marland Kitchen Marital Status:   Intimate Partner Violence:   . Fear of Current or Ex-Partner:   . Emotionally Abused:   Marland Kitchen Physically Abused:   . Sexually Abused:     Past Medical History, Surgical history, Social history, and Family history were reviewed and updated as appropriate.   Please see review of systems for further details on the patient's review from today.   Objective:   Physical Exam:  There were no vitals taken for this visit.  Physical Exam Neurological:     Mental Status: He is alert and oriented to person, place, and time.     Cranial Nerves: No dysarthria.  Psychiatric:        Attention and Perception: Attention normal.        Mood and Affect: Mood is anxious and depressed.        Speech: Speech normal.        Behavior: Behavior is cooperative.        Thought Content: Thought content normal. Thought content is not paranoid or delusional. Thought content does not include homicidal or suicidal ideation. Thought content does not  include homicidal or suicidal plan.        Cognition and Memory: Cognition and memory normal.     Comments: Fair insight and judgment. Ongoing mixed dysphoric manic agitation and racing thoughts.  For at least several months without change with Abilify 10     Lab Review:     Component Value Date/Time   NA 141 07/06/2014 1458   K 4.2 07/06/2014 1458   CL 104 07/06/2014 1458   CO2 27 07/06/2014 1458   GLUCOSE 81 07/06/2014 1458   BUN 12 07/06/2014 1458   CREATININE 0.70 07/06/2014 1458   CALCIUM 9.4 07/06/2014 1458   PROT 7.2 07/06/2014 1458   ALBUMIN 4.7 07/06/2014 1458   AST 18 07/06/2014 1458   ALT 8 07/06/2014 1458   ALKPHOS 44 07/06/2014 1458   BILITOT 0.5 07/06/2014 1458       Component Value Date/Time   WBC 4.7 01/23/2019 0000   RBC 4.66 01/23/2019 0000   HGB 14.4 01/23/2019 0000   HCT 43.3 01/23/2019 0000   PLT 167 01/23/2019 0000   MCV 92.9 01/23/2019 0000   MCH 30.9 01/23/2019 0000   MCHC 33.3 01/23/2019 0000   RDW 12.5 01/23/2019 0000    No results found for: POCLITH, LITHIUM   Lab Results  Component Value Date   VALPROATE 119.8 (H) 01/23/2019     .res Assessment: Plan:    Mason Owens was seen today for follow-up, depression, anxiety, adhd and back pain.  Diagnoses and all orders for this visit:  Attention deficit hyperactivity disorder (ADHD), predominantly inattentive type -     buPROPion (WELLBUTRIN XL) 300 MG 24 hr tablet; Take 1 tablet (300 mg total) by mouth daily. -     dexmethylphenidate (FOCALIN XR) 10 MG 24 hr capsule; Take 1 capsule (10 mg total) by mouth every morning. -     dexmethylphenidate (FOCALIN XR) 10 MG 24 hr capsule; Take 1 capsule (10 mg total) by mouth daily. -     dexmethylphenidate (FOCALIN XR) 10 MG 24 hr  capsule; Take 1 capsule (10 mg total) by mouth daily.  Bipolar 1 disorder, mixed, moderate (HCC) -     ARIPiprazole (ABILIFY) 15 MG tablet; Take 1 tablet (15 mg total) by mouth daily. -     buPROPion (WELLBUTRIN XL) 300 MG  24 hr tablet; Take 1 tablet (300 mg total) by mouth daily. -     gabapentin (NEURONTIN) 300 MG capsule; Take 1 capsule (300 mg total) by mouth 3 (three) times daily.  Acute bilateral low back pain without sciatica -     gabapentin (NEURONTIN) 300 MG capsule; Take 1 capsule (300 mg total) by mouth 3 (three) times daily.  Generalized anxiety disorder -     gabapentin (NEURONTIN) 300 MG capsule; Take 1 capsule (300 mg total) by mouth 3 (three) times daily.     Greater than 50% of 25 minutes face to face time with patient was spent on counseling and coordination of care. We discussed Patient has had a relapse of bipolar disorder with mixed symptoms.  This was described to him. There is been no significant change in symptoms externally but mild improvement in behavior-year-old control internally from the increase in Depakote to 2000 mg or the switch from [place] to Abilify 10 mg.  Overall the benefit to the increase has been slight.  He would like better control.  He continues to have mixed symptoms of bipolar disorder plus anxiety.  Another concern expressed was that he will lose his parents insurance and may lose insurance altogether when he is 62 in November 2021.   Therefore we are attempting to use generic meds.  Discussed potential metabolic side effects associated with atypical antipsychotics, as well as potential risk for movement side effects. Advised pt to contact office if movement side effects occur.   He is aware to avoid THC in daytime with meds.  He admits he is smoking too much in the daytime and that that is probably not helpful.  He is trying to cut it back.  Continue Depakote to 2000 mg daily  Discussed side effects in detail.   Explained the fairly wide dosing range options.  Checked valproic acid level and CBC.  CBC unremarkable in November 2020.  Specifically platelet count 167 and normal WBC and hematocrit.  Valproic acid level slightly elevated at 120 which is considered  acceptable in psychiatry.  No side effects or particular benefit from Abilify 10 mg.  Therefore increase to 15 mg which is a more typical mood stabilizing dose especially in a young healthy person.  He agrees to this plan looking for improvement in irritability and depression and anxiety.  We will use gabapentin off label for anxiety and bipolar disorder irritability which could also help with his back pain.  Discussed specifically that I am not making any diagnosis of the type of back pain that he has given that I cannot make a physical exam at this time but he believes it is related to overworking.  The back pain is interfering with his sleep.  Gabapentin could potentially serve several purposes at this point.  Emphasized that his back pain gets worse or if he starts getting sciatica symptoms that he needs to see a physician and have a physical exam.  He agrees discussed side effects about gabapentin. Start gabapentin 300 mg 3 times daily.  Continue Focalin XR 10 mg every morning for ADD.  Fu 6 weeks  Meredith Staggers, MD, DFAPA  Please see After Visit Summary for patient specific instructions.  No future  appointments.  No orders of the defined types were placed in this encounter.     -------------------------------

## 2019-08-13 ENCOUNTER — Telehealth: Payer: No Typology Code available for payment source | Admitting: Psychiatry

## 2019-08-25 ENCOUNTER — Other Ambulatory Visit: Payer: Self-pay | Admitting: Psychiatry

## 2019-08-25 DIAGNOSIS — F3162 Bipolar disorder, current episode mixed, moderate: Secondary | ICD-10-CM

## 2019-08-25 DIAGNOSIS — F900 Attention-deficit hyperactivity disorder, predominantly inattentive type: Secondary | ICD-10-CM

## 2019-10-08 ENCOUNTER — Other Ambulatory Visit: Payer: Self-pay | Admitting: Psychiatry

## 2019-10-21 ENCOUNTER — Ambulatory Visit: Payer: No Typology Code available for payment source | Admitting: Psychiatry

## 2019-11-25 ENCOUNTER — Other Ambulatory Visit: Payer: Self-pay | Admitting: Psychiatry

## 2019-11-25 DIAGNOSIS — F3162 Bipolar disorder, current episode mixed, moderate: Secondary | ICD-10-CM

## 2019-11-25 NOTE — Telephone Encounter (Signed)
review 

## 2019-12-22 ENCOUNTER — Other Ambulatory Visit: Payer: Self-pay | Admitting: Psychiatry

## 2019-12-22 DIAGNOSIS — F3162 Bipolar disorder, current episode mixed, moderate: Secondary | ICD-10-CM

## 2020-01-18 ENCOUNTER — Telehealth: Payer: Self-pay | Admitting: Psychiatry

## 2020-01-18 ENCOUNTER — Other Ambulatory Visit: Payer: Self-pay | Admitting: Psychiatry

## 2020-01-18 DIAGNOSIS — F900 Attention-deficit hyperactivity disorder, predominantly inattentive type: Secondary | ICD-10-CM

## 2020-01-18 MED ORDER — DEXMETHYLPHENIDATE HCL ER 10 MG PO CP24: 10.0000 mg | ORAL_CAPSULE | Freq: Every morning | ORAL | 0 refills | Status: DC

## 2020-01-18 NOTE — Telephone Encounter (Signed)
Rx sent 

## 2020-01-18 NOTE — Telephone Encounter (Signed)
Mother called to say that patient's insurance will be ending soon when he turns 34. They want to get an RX of Focalin 10mg  sent in to the [facility] on [address] So he can go ahead and get it filled.

## 2020-01-22 ENCOUNTER — Telehealth: Payer: Self-pay

## 2020-01-22 NOTE — Telephone Encounter (Signed)
Prior authorization submitted and approved for DEXMETHYLPHENIDATE ER 10 MG #30 effective 01/20/2020-01/19/2021 PA# [id number] with [employer]

## 2020-01-23 ENCOUNTER — Other Ambulatory Visit: Payer: Self-pay | Admitting: Psychiatry

## 2020-01-23 DIAGNOSIS — F3162 Bipolar disorder, current episode mixed, moderate: Secondary | ICD-10-CM

## 2020-05-16 ENCOUNTER — Other Ambulatory Visit: Payer: Self-pay

## 2020-05-16 ENCOUNTER — Encounter: Payer: Self-pay | Admitting: Psychiatry

## 2020-05-16 ENCOUNTER — Ambulatory Visit (INDEPENDENT_AMBULATORY_CARE_PROVIDER_SITE_OTHER): Payer: 59 | Admitting: Psychiatry

## 2020-05-16 VITALS — BP 111/58 | HR 70

## 2020-05-16 DIAGNOSIS — F900 Attention-deficit hyperactivity disorder, predominantly inattentive type: Secondary | ICD-10-CM | POA: Diagnosis not present

## 2020-05-16 DIAGNOSIS — F411 Generalized anxiety disorder: Secondary | ICD-10-CM

## 2020-05-16 DIAGNOSIS — F3162 Bipolar disorder, current episode mixed, moderate: Secondary | ICD-10-CM | POA: Diagnosis not present

## 2020-05-16 NOTE — Progress Notes (Signed)
Mason Owens [id number] 1995/09/08 24 y.o.    Subjective:   Patient ID:  Mason Owens is a 25 y.o. (DOB 1995/05/29) male.  Chief Complaint:  Chief Complaint  Patient presents with  . Follow-up  . Attention deficit hyperactivity disorder (ADHD), predominan  . Bipolar 1 disorder, mixed, moderate (HCC)    Depression        Associated symptoms include decreased concentration and myalgias.  Associated symptoms include no suicidal ideas.  Mason Owens presents to the office today for follow-up of above.  seen December 25, 2018.  Depakote was increased from 1500 to 2000 mg daily because of mixed symptoms.  Work in appt to evaluate outcome of the change.  GF noticed no change.  He feels a little more control some days but not daily.  A lot of days very labile.  This week in a funk and procrastinating.  Yesterday easily overwhelmed by simple things.  Feels he needs to  Continue emotional growth in therapy also.  seen November 2020.  Valproic acid CBC checked and noted.  No med changes are made.  Last seen 06/02/2019 with the following noted: Honestly still a mess and not able to pick himself up.  Crappy drug and no motivation and just getting by.  Lately worse anxiety.  In his head too much and reacting to stress.   Isolated.  Coworker not a good influence.  Clouded thoughts with anxiety.  Moved to [place].  Life events happened.  Moved in with Mason Owens of 2y.  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   Started hard physically intense job and got to the end of himself.  Still feels out of control mentally.  Quit the job 2 weeks ago.  Not doing healthy things right now.  Ruminating thoughts with a lot of time. Anxiety throwing him into mood swings.  Jumping from one thing to another.  Easily overwhelmed emotionally. Smokes weed a bit too much as a coping mechanism.  Nothing to do and gets stoned and then more negative thinking.   Because of persistent symptoms the plan  was to wean Latuda and start Vraylar 1.5 mg daily for bipolar depression.  However insurance would not cover it so we switched to Abilify 10 mg daily.  There was also concern that the patient will eventually lose his insurance and he needed to be on a generic  07/30/19 with the following noted: No change with Abilify much.  Life in general sucks.  He thinks that may be a drag on his mood as well.  Things are not working out in [place] as he had hoped. Started a new job and still needs his Focalin but is out.  He has been compliant with medications.  No side effects with Abilify.  Sleeping all right except complaining of some back pain and asked if there is anything I could do to help with that. Plan: We will use gabapentin off label for anxiety and bipolar disorder irritability which could also help with his back pain.  Discussed specifically that I am not making any diagnosis of the type of back pain that he has given that I cannot make a physical exam at this time but he believes it is related to overworking.  The back pain is interfering with his sleep.  Gabapentin could potentially serve several purposes at this point.  Emphasized that his back pain gets worse or if he starts getting sciatica symptoms that he needs to see  a physician and have a physical exam.  He agrees discussed side effects about gabapentin. Start gabapentin 300 mg 3 times daily.  05/16/2020 appointment with the following noted: Seen with mother Mason Owens A lot of ups and downs. Rel in [place] ended and back home since September. Was self medicating with cannabis keeping me in a funk for awhile and has cut back.  A lot of emotional ups and downs with GF passed.  Last several months are harder.  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  Would like to get reevaluated for mental health things.  Pretty anxious and some OCD tendencies.  I feel crazy but I don't think so.  Wants a label.  Seeing a therapist just started, Mason BeathFozzy  Shah, LCSW, [place] including addictions. Does have health insurance right now.    Sleep 8 hours and good usually.   Hx gabapentin 300 TID with some benefit but had withdrawal pain if missed it.  Mason Owens noticed some periods of depression and too down to be motivated to do something differently.  Was using a lot of [place]C in [place].  A lot of anxiety and beating himself up.  Some irritability with family. Often remorseful.    Patient denies difficulty with sleep initiation or maintenance. Denies appetite disturbance.  Patient reports that energy and motivation have been good.  Concentration impaired when emotionally upset.  Periods of agitation both internally and behaviorally.  Patient denies any suicidal ideation.  Past Psychiatric Medication Trials: Depakote ER, Tegretol,  lamotrigine, lithium 1200 no response, Latuda 120, perphenazine 12, Abilify 10 Wellbutrin, sertraline no response, Pristiq 50,  Focalin, Ritalin , Concerta crash, Nuvigil, Intuniv side effects Seen in this office since 2008  Review of Systems:  Review of Systems  Cardiovascular: Negative for palpitations.  Musculoskeletal: Positive for back pain and myalgias.  Neurological: Negative for dizziness.  Psychiatric/Behavioral: Positive for agitation, decreased concentration, depression and dysphoric mood. Negative for behavioral problems, confusion, hallucinations, self-injury, sleep disturbance and suicidal ideas. The patient is nervous/anxious. The patient is not hyperactive.     Medications: I have reviewed the patient's current medications.  Current Outpatient Medications  Medication Sig Dispense Refill  . ARIPiprazole (ABILIFY) 15 MG tablet TAKE 1 TABLET BY MOUTH EVERY DAY 90 tablet 1  . buPROPion (WELLBUTRIN XL) 300 MG 24 hr tablet TAKE 1 TABLET BY MOUTH EVERY DAY 90 tablet 2  . divalproex (DEPAKOTE) 500 MG DR tablet TAKE 4 TABLETS (2,000 MG TOTAL) BY MOUTH AT BEDTIME. (Patient taking differently: Take 2,000 mg by  mouth at bedtime. Pt takes 3 tabs.) 360 tablet 1  . dexmethylphenidate (FOCALIN XR) 10 MG 24 hr capsule Take 1 capsule (10 mg total) by mouth daily. (Patient not taking: Reported on 05/16/2020) 30 capsule 0  . dexmethylphenidate (FOCALIN XR) 10 MG 24 hr capsule Take 1 capsule (10 mg total) by mouth daily. (Patient not taking: Reported on 05/16/2020) 30 capsule 0  . dexmethylphenidate (FOCALIN XR) 10 MG 24 hr capsule Take 1 capsule (10 mg total) by mouth every morning. (Patient not taking: Reported on 05/16/2020) 30 capsule 0   No current facility-administered medications for this visit.    Medication Side Effects: None  Allergies: No Known Allergies  Past Medical History:  Diagnosis Date  . Bipolar disorder (HCC)     Family History  Problem Relation Age of Onset  . ADD / ADHD Father   . Depression Father   . Anxiety disorder Brother   . Depression Maternal Grandmother   . Anxiety  disorder Maternal Grandmother     Social History   Socioeconomic History  . Marital status: Single    Spouse name: Not on file  . Number of children: Not on file  . Years of education: Not on file  . Highest education level: Not on file  Occupational History  . Not on file  Tobacco Use  . Smoking status: Current Every Day Smoker    Packs/day: 0.50    Types: Cigarettes  . Smokeless tobacco: Never Used  Substance and Sexual Activity  . Alcohol use: Yes    Alcohol/week: 4.0 - 5.0 standard drinks    Types: 4 - 5 Cans of beer per week    Comment: all types of alcohol drinks  . Drug use: Yes    Types: Marijuana    Comment: marijuana daily  . Sexual activity: Yes    Birth control/protection: Condom  Other Topics Concern  . Not on file  Social History Narrative  . Not on file   Social Determinants of Health   Financial Resource Strain: Not on file  Food Insecurity: Not on file  Transportation Needs: Not on file  Physical Activity: Not on file  Stress: Not on file  Social Connections: Not  on file  Intimate Partner Violence: Not on file    Past Medical History, Surgical history, Social history, and Family history were reviewed and updated as appropriate.   Please see review of systems for further details on the patient's review from today.   Objective:   Physical Exam:  BP (!) 111/58   Pulse 70   Physical Exam Constitutional:      General: He is not in acute distress. Musculoskeletal:        General: No deformity.  Neurological:     Mental Status: He is alert and oriented to person, place, and time.     Cranial Nerves: No dysarthria.     Coordination: Coordination normal.  Psychiatric:        Attention and Perception: Attention and perception normal. He does not perceive auditory or visual hallucinations.        Mood and Affect: Mood is anxious and depressed. Affect is not labile, blunt, angry or inappropriate.        Speech: Speech normal.        Behavior: Behavior normal. Behavior is cooperative.        Thought Content: Thought content normal. Thought content is not paranoid or delusional. Thought content does not include homicidal or suicidal ideation. Thought content does not include homicidal or suicidal plan.        Cognition and Memory: Cognition and memory normal.        Judgment: Judgment normal.     Comments: Fair insight and judgment. Ongoing mixed dysphoric manic agitation and racing thoughts.  cycles     Lab Review:     Component Value Date/Time   NA 141 07/06/2014 1458   K 4.2 07/06/2014 1458   CL 104 07/06/2014 1458   CO2 27 07/06/2014 1458   GLUCOSE 81 07/06/2014 1458   BUN 12 07/06/2014 1458   CREATININE 0.70 07/06/2014 1458   CALCIUM 9.4 07/06/2014 1458   PROT 7.2 07/06/2014 1458   ALBUMIN 4.7 07/06/2014 1458   AST 18 07/06/2014 1458   ALT 8 07/06/2014 1458   ALKPHOS 44 07/06/2014 1458   BILITOT 0.5 07/06/2014 1458       Component Value Date/Time   WBC 4.7 01/23/2019 0000   RBC 4.66 01/23/2019 0000  HGB 14.4 01/23/2019 0000    HCT 43.3 01/23/2019 0000   PLT 167 01/23/2019 0000   MCV 92.9 01/23/2019 0000   MCH 30.9 01/23/2019 0000   MCHC 33.3 01/23/2019 0000   RDW 12.5 01/23/2019 0000    No results found for: POCLITH, LITHIUM   Lab Results  Component Value Date   VALPROATE 119.8 (H) 01/23/2019     .res Assessment: Plan:    [place] was seen today for follow-up, attention deficit hyperactivity disorder (adhd), predominan and bipolar 1 disorder, mixed, moderate (hcc).  Diagnoses and all orders for this visit:  Bipolar 1 disorder, mixed, moderate (HCC)  Attention deficit hyperactivity disorder (ADHD), predominantly inattentive type  Generalized anxiety disorder     Greater than 50% of 45 minutes face to face time with patient was spent on counseling and coordination of care. We discussed Patient has had a relapse of bipolar disorder with mixed symptoms.  This was described to him. There is been no significant change in symptoms externally but mild improvement in behavior-year-old control internally from the increase in Depakote to 2000 mg or the switch from [place] to Abilify 10 mg.  Overall the benefit to the increase has been slight.  He would like better control.  He continues to have mixed symptoms of bipolar disorder plus anxiety.  Discussed potential metabolic side effects associated with atypical antipsychotics, as well as potential risk for movement side effects. Advised pt to contact office if movement side effects occur.   He is aware to avoid THC in daytime with meds.  He admits he is smoking too much in the daytime and that that is probably not helpful.  He is trying to cut it back.  Continue Depakote to 2000 mg daily  Discussed side effects in detail.   Explained the fairly wide dosing range options.  Checked valproic acid level and CBC.  CBC unremarkable in November 2020.  Specifically platelet count 167 and normal WBC and hematocrit.  Valproic acid level slightly elevated at 120 which is  considered acceptable in psychiatry.  No side effects or particular benefit from Abilify 10 mg.  Therefore continue 15 mg which is a more typical mood stabilizing dose especially in a young healthy person.  He agrees to this plan looking for improvement in irritability and depression and anxiety. Option change Vraylar.  He wants to wait a couple of months and then consider.  Continue therapy. CBT  Continue Focalin XR 10 mg as needed ADD.  Fu 8 weeks  Meredith Staggers, MD, DFAPA  Please see After Visit Summary for patient specific instructions.  Future Appointments  Date Time Provider Department Center  07/14/2020  9:30 AM Cottle, Steva Ready., MD [facility] None    No orders of the defined types were placed in this encounter.     -------------------------------

## 2020-07-14 ENCOUNTER — Ambulatory Visit (INDEPENDENT_AMBULATORY_CARE_PROVIDER_SITE_OTHER): Payer: 59 | Admitting: Psychiatry

## 2020-07-14 ENCOUNTER — Other Ambulatory Visit: Payer: Self-pay

## 2020-07-14 ENCOUNTER — Encounter: Payer: Self-pay | Admitting: Psychiatry

## 2020-07-14 VITALS — BP 116/75 | HR 77

## 2020-07-14 DIAGNOSIS — G4750 Parasomnia, unspecified: Secondary | ICD-10-CM | POA: Diagnosis not present

## 2020-07-14 DIAGNOSIS — F3162 Bipolar disorder, current episode mixed, moderate: Secondary | ICD-10-CM

## 2020-07-14 DIAGNOSIS — F900 Attention-deficit hyperactivity disorder, predominantly inattentive type: Secondary | ICD-10-CM | POA: Diagnosis not present

## 2020-07-14 DIAGNOSIS — F411 Generalized anxiety disorder: Secondary | ICD-10-CM

## 2020-07-14 MED ORDER — ARIPIPRAZOLE 20 MG PO TABS
20.0000 mg | ORAL_TABLET | Freq: Every day | ORAL | 1 refills | Status: DC
Start: 2020-07-14 — End: 2020-08-08

## 2020-07-14 MED ORDER — CLONIDINE HCL 0.1 MG PO TABS: 0.1000 mg | ORAL_TABLET | Freq: Every evening | ORAL | 0 refills | Status: DC

## 2020-07-14 NOTE — Progress Notes (Signed)
Mason Owens [id number] 03/24/1995 25 y.o.    Subjective:   Patient ID:  Mason Owens is a 25 y.o. (DOB Sep 25, 1995) male.  Chief Complaint:  Chief Complaint  Patient presents with  . Follow-up  . Bipolar 1 disorder, mixed, moderate (HCC)    Depression        Associated symptoms include decreased concentration and myalgias.  Associated symptoms include no suicidal ideas.  Mason Owens presents to the office today for follow-up of above.  seen December 25, 2018.  Depakote was increased from 1500 to 2000 mg daily because of mixed symptoms.  Work in appt to evaluate outcome of the change.  GF noticed no change.  He feels a little more control some days but not daily.  A lot of days very labile.  This week in a funk and procrastinating.  Yesterday easily overwhelmed by simple things.  Feels he needs to  Continue emotional growth in therapy also.  seen November 2020.  Valproic acid CBC checked and noted.  No med changes are made.   seen 06/02/2019 with the following noted: Honestly still a mess and not able to pick himself up.  Crappy drug and no motivation and just getting by.  Lately worse anxiety.  In his head too much and reacting to stress.   Isolated.  Coworker not a good influence.  Clouded thoughts with anxiety.  Moved to [place].  Life events happened.  Moved in with Mason Owens of 2y.  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   Started hard physically intense job and got to the end of himself.  Still feels out of control mentally.  Quit the job 2 weeks ago.  Not doing healthy things right now.  Ruminating thoughts with a lot of time. Anxiety throwing him into mood swings.  Jumping from one thing to another.  Easily overwhelmed emotionally. Smokes weed a bit too much as a coping mechanism.  Nothing to do and gets stoned and then more negative thinking.   Because of persistent symptoms the plan was to wean Latuda and start Vraylar 1.5 mg daily for bipolar  depression.  However insurance would not cover it so we switched to Abilify 10 mg daily.  There was also concern that the patient will eventually lose his insurance and he needed to be on a generic  07/30/19 with the following noted: No change with Abilify much.  Life in general sucks.  He thinks that may be a drag on his mood as well.  Things are not working out in [place] as he had hoped. Started a new job and still needs his Focalin but is out.  He has been compliant with medications.  No side effects with Abilify.  Sleeping all right except complaining of some back pain and asked if there is anything I could do to help with that. Plan: We will use gabapentin off label for anxiety and bipolar disorder irritability which could also help with his back pain.  Discussed specifically that I am not making any diagnosis of the type of back pain that he has given that I cannot make a physical exam at this time but he believes it is related to overworking.  The back pain is interfering with his sleep.  Gabapentin could potentially serve several purposes at this point.  Emphasized that his back pain gets worse or if he starts getting sciatica symptoms that he needs to see a physician and have a physical exam.  He agrees discussed side effects about gabapentin. Start gabapentin 300 mg 3 times daily.  05/16/2020 appointment with the following noted: Seen with mother Mason Owens A lot of ups and downs. Rel in [place] ended and back home since September. Was self medicating with cannabis keeping me in a funk for awhile and has cut back.  A lot of emotional ups and downs with GF passed.  Last several months are harder.  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  Would like to get reevaluated for mental health things.  Pretty anxious and some OCD tendencies.  I feel crazy but I don't think so.  Wants a label.  Seeing a therapist just started, Rosemarie BeathFozzy Shah, LCSW, [place] including addictions. Does have health  insurance right now.    Sleep 8 hours and good usually.   Hx gabapentin 300 TID with some benefit but had withdrawal pain if missed it. Mason Owens noticed some periods of depression and too down to be motivated to do something differently.  Was using a lot of THC in [place].  A lot of anxiety and beating himself up.  Some irritability with family. Often remorseful.   Plan: No side effects or particular benefit from Abilify 10 mg.  Therefore continue 15 mg which is a more typical mood stabilizing dose especially in a young healthy person.  He agrees to this plan looking for improvement in irritability and depression and anxiety. Continue Depakote 2000 mg daily.  His symptoms worsened whenever he tried to wean this medicine  07/14/2020 appointment with the following noted: Not noticed too much.   Still  Going through a lot of things with on again and off again relationship with GF.  Weed use varies.  No real changes with increase Abilify to 15 mg  Good or bad.  Someone told him he's "really violent" in his sleep.   Continues Wellbutrin xl 300 AM and Depakote DR 1500 HS. Vraylar coverage is not good with insurance.  Patient denies difficulty with sleep initiation or maintenance. Denies appetite disturbance.  Patient reports that energy and motivation have been good.  Concentration impaired when emotionally upset.  Periods of agitation both internally and behaviorally.  Patient denies any suicidal ideation.  Past Psychiatric Medication Trials: Depakote ER, Tegretol,  lamotrigine, lithium 1200 no response, Latuda 120, perphenazine 12, Abilify 15  Wellbutrin, sertraline no response, Pristiq 50,  Focalin, Ritalin , Concerta crash, Nuvigil, Intuniv side effects Seen in this office since 2008  Review of Systems:  Review of Systems  Cardiovascular: Negative for chest pain and palpitations.  Musculoskeletal: Positive for back pain and myalgias.  Neurological: Negative for dizziness.   Psychiatric/Behavioral: Positive for agitation, decreased concentration, depression and dysphoric mood. Negative for behavioral problems, confusion, hallucinations, self-injury, sleep disturbance and suicidal ideas. The patient is nervous/anxious. The patient is not hyperactive.     Medications: I have reviewed the patient's current medications.  Current Outpatient Medications  Medication Sig Dispense Refill  . ARIPiprazole (ABILIFY) 15 MG tablet TAKE 1 TABLET BY MOUTH EVERY DAY 90 tablet 1  . buPROPion (WELLBUTRIN XL) 300 MG 24 hr tablet TAKE 1 TABLET BY MOUTH EVERY DAY 90 tablet 2  . divalproex (DEPAKOTE) 500 MG DR tablet TAKE 4 TABLETS (2,000 MG TOTAL) BY MOUTH AT BEDTIME. (Patient taking differently: Take 2,000 mg by mouth at bedtime. Pt takes 3 tabs.) 360 tablet 1  . dexmethylphenidate (FOCALIN XR) 10 MG 24 hr capsule Take 1 capsule (10 mg total) by mouth daily. (Patient not taking: No sig  reported) 30 capsule 0  . dexmethylphenidate (FOCALIN XR) 10 MG 24 hr capsule Take 1 capsule (10 mg total) by mouth daily. (Patient not taking: No sig reported) 30 capsule 0  . dexmethylphenidate (FOCALIN XR) 10 MG 24 hr capsule Take 1 capsule (10 mg total) by mouth every morning. (Patient not taking: No sig reported) 30 capsule 0   No current facility-administered medications for this visit.    Medication Side Effects: None  Allergies: No Known Allergies  Past Medical History:  Diagnosis Date  . Bipolar disorder (HCC)     Family History  Problem Relation Age of Onset  . ADD / ADHD Father   . Depression Father   . Anxiety disorder Brother   . Depression Maternal Grandmother   . Anxiety disorder Maternal Grandmother     Social History   Socioeconomic History  . Marital status: Single    Spouse name: Not on file  . Number of children: Not on file  . Years of education: Not on file  . Highest education level: Not on file  Occupational History  . Not on file  Tobacco Use  . Smoking  status: Current Every Day Smoker    Packs/day: 0.50    Types: Cigarettes  . Smokeless tobacco: Never Used  Substance and Sexual Activity  . Alcohol use: Yes    Alcohol/week: 4.0 - 5.0 standard drinks    Types: 4 - 5 Cans of beer per week    Comment: all types of alcohol drinks  . Drug use: Yes    Types: Marijuana    Comment: marijuana daily  . Sexual activity: Yes    Birth control/protection: Condom  Other Topics Concern  . Not on file  Social History Narrative  . Not on file   Social Determinants of Health   Financial Resource Strain: Not on file  Food Insecurity: Not on file  Transportation Needs: Not on file  Physical Activity: Not on file  Stress: Not on file  Social Connections: Not on file  Intimate Partner Violence: Not on file    Past Medical History, Surgical history, Social history, and Family history were reviewed and updated as appropriate.   Please see review of systems for further details on the patient's review from today.   Objective:   Physical Exam:  BP 116/75   Pulse 77   Physical Exam Constitutional:      General: He is not in acute distress. Musculoskeletal:        General: No deformity.  Neurological:     Mental Status: He is alert and oriented to person, place, and time.     Cranial Nerves: No dysarthria.     Coordination: Coordination normal.  Psychiatric:        Attention and Perception: Attention and perception normal. He does not perceive auditory or visual hallucinations.        Mood and Affect: Mood is anxious and depressed. Affect is not labile, blunt, angry or inappropriate.        Speech: Speech normal.        Behavior: Behavior normal. Behavior is cooperative.        Thought Content: Thought content normal. Thought content is not paranoid or delusional. Thought content does not include homicidal or suicidal ideation. Thought content does not include homicidal or suicidal plan.        Cognition and Memory: Cognition and memory  normal.        Judgment: Judgment normal.  Comments: Fair insight and judgment. Ongoing mixed dysphoric manic agitation and racing thoughts.  cycles     Lab Review:     Component Value Date/Time   NA 141 07/06/2014 1458   K 4.2 07/06/2014 1458   CL 104 07/06/2014 1458   CO2 27 07/06/2014 1458   GLUCOSE 81 07/06/2014 1458   BUN 12 07/06/2014 1458   CREATININE 0.70 07/06/2014 1458   CALCIUM 9.4 07/06/2014 1458   PROT 7.2 07/06/2014 1458   ALBUMIN 4.7 07/06/2014 1458   AST 18 07/06/2014 1458   ALT 8 07/06/2014 1458   ALKPHOS 44 07/06/2014 1458   BILITOT 0.5 07/06/2014 1458       Component Value Date/Time   WBC 4.7 01/23/2019 0000   RBC 4.66 01/23/2019 0000   HGB 14.4 01/23/2019 0000   HCT 43.3 01/23/2019 0000   PLT 167 01/23/2019 0000   MCV 92.9 01/23/2019 0000   MCH 30.9 01/23/2019 0000   MCHC 33.3 01/23/2019 0000   RDW 12.5 01/23/2019 0000    No results found for: POCLITH, LITHIUM   Lab Results  Component Value Date   VALPROATE 119.8 (H) 01/23/2019     .res Assessment: Plan:    Corby was seen today for follow-up and bipolar 1 disorder, mixed, moderate (hcc).  Diagnoses and all orders for this visit:  Bipolar 1 disorder, mixed, moderate (HCC)  Attention deficit hyperactivity disorder (ADHD), predominantly inattentive type  Generalized anxiety disorder     Greater than 50% of 45 minutes face to face time with patient was spent on counseling and coordination of care. We discussed Patient has had a relapse of bipolar disorder with mixed symptoms.  This was described to him. There is been no significant change in symptoms externally but mild improvement in behavior-year-old control internally from the increase in Depakote to 2000 mg or the switch from [place] to Abilify 10 mg.  Overall the benefit to the increase has been slight.  He would like better control.  He continues to have mixed symptoms of bipolar disorder plus anxiety.  Discussed potential  metabolic side effects associated with atypical antipsychotics, as well as potential risk for movement side effects. Advised pt to contact office if movement side effects occur.   He is aware to avoid THC in daytime with meds.  He admits he is smoking too much in the daytime and that that is probably not helpful.  He is trying to cut it back.  Continue Depakote to 2000 mg daily  Discussed side effects in detail.   Explained the fairly wide dosing range options.  Checked valproic acid level and CBC.  CBC unremarkable in November 2020.  Specifically platelet count 167 and normal WBC and hematocrit.  Valproic acid level slightly elevated at 120 which is considered acceptable in psychiatry.  No side effects or particular benefit from Abilify 1 mg.  Therefore increase to 20 mg..  He agrees to this plan looking for improvement in irritability and depression and anxiety. Option change Vraylar.  But cost concerns.  DT thrashing in his sleep off label trial of clonidine 0.1 mg HS.  Continue therapy. CBT  Continue Focalin XR 10 mg as needed ADD.  Fu 6 weeks  Meredith Staggers, MD, DFAPA  Please see After Visit Summary for patient specific instructions.  No future appointments.  No orders of the defined types were placed in this encounter.     -------------------------------

## 2020-08-05 ENCOUNTER — Other Ambulatory Visit: Payer: Self-pay | Admitting: Psychiatry

## 2020-08-05 DIAGNOSIS — F3162 Bipolar disorder, current episode mixed, moderate: Secondary | ICD-10-CM

## 2020-08-05 DIAGNOSIS — G4750 Parasomnia, unspecified: Secondary | ICD-10-CM

## 2020-08-08 NOTE — Telephone Encounter (Signed)
At the last appt this was added to help his sleep specifically his thrashing around.  Ask if it helped and he tolerated it.  We could increase it if needed.

## 2020-08-11 NOTE — Telephone Encounter (Signed)
OK to stop it if he thinks it's causing SE

## 2020-08-11 NOTE — Telephone Encounter (Signed)
Pt stated he does not see a difference,still tossing and turning.He said his memory is not as good and he is not sure if that's a side affect.He is willing to increase if you think he should continue,but if not he would rather stop the med.

## 2020-10-04 ENCOUNTER — Ambulatory Visit: Payer: 59 | Admitting: Psychiatry

## 2020-11-14 ENCOUNTER — Telehealth: Payer: Self-pay | Admitting: Psychiatry

## 2020-11-14 NOTE — Telephone Encounter (Signed)
LVM to return call.

## 2020-11-14 NOTE — Telephone Encounter (Signed)
Next visit is 01/17/21. Mason Owens said he stopped taking all of his meds in mid June. He states he doesn't feel like he needs them and wanted to see if he could handle without being on his mental health pills. Please call him at [phone].

## 2020-11-14 NOTE — Telephone Encounter (Signed)
Pt stated he stopped taking meds in June because he felt like he would not need them.Now he wants to restart them because he is having some anxiety and depression again.He realizes the meds were helping him.

## 2020-11-14 NOTE — Telephone Encounter (Signed)
If he wants to restart each of the medicines then do so as follows: Start Depakote 1 tablet daily for 5 days then add 1 tablet daily to a total of 4 daily. Wellbutrin XL has to be restarted at 150 mg 1 daily for 5 to 7 days, then 2 in the morning or 300 mg each morning.  That is for depression  Because he did not get an adequate response to Depakote and Wellbutrin alone we had initiated a trial of Abilify and he got up to a total dose of 20 mg a day.  If he wants to restart that then restart at one half of a 20 mg tablet daily for 2 weeks then 1 tablet daily.  Please send in a month supply of each of these prescriptions if needed.

## 2020-11-15 ENCOUNTER — Other Ambulatory Visit: Payer: Self-pay

## 2020-11-15 DIAGNOSIS — F3162 Bipolar disorder, current episode mixed, moderate: Secondary | ICD-10-CM

## 2020-11-15 MED ORDER — ARIPIPRAZOLE 20 MG PO TABS: ORAL_TABLET | ORAL | 0 refills | Status: DC

## 2020-11-15 MED ORDER — DIVALPROEX SODIUM 500 MG PO DR TAB
DELAYED_RELEASE_TABLET | ORAL | 0 refills | Status: DC
Start: 2020-11-15 — End: 2020-12-20

## 2020-11-15 MED ORDER — BUPROPION HCL ER (XL) 150 MG PO TB24: ORAL_TABLET | ORAL | 0 refills | Status: DC

## 2020-11-15 NOTE — Telephone Encounter (Signed)
Rx sent and pt informed  

## 2020-11-18 ENCOUNTER — Other Ambulatory Visit: Payer: Self-pay | Admitting: Psychiatry

## 2020-11-18 DIAGNOSIS — F3162 Bipolar disorder, current episode mixed, moderate: Secondary | ICD-10-CM

## 2020-11-18 NOTE — Telephone Encounter (Signed)
Pt called and said that his mom went [facility] and [facility] all of his [facility]. He forgot to tell her he was going back on the meds. So please resend all the scripts

## 2020-11-26 ENCOUNTER — Other Ambulatory Visit: Payer: Self-pay

## 2020-11-26 ENCOUNTER — Encounter: Payer: Self-pay | Admitting: Emergency Medicine

## 2020-11-26 ENCOUNTER — Ambulatory Visit (INDEPENDENT_AMBULATORY_CARE_PROVIDER_SITE_OTHER): Payer: 59

## 2020-11-26 ENCOUNTER — Ambulatory Visit
Admission: EM | Admit: 2020-11-26 | Discharge: 2020-11-26 | Disposition: A | Discharge status: Home / Self Care | Payer: 59 | Attending: Internal Medicine | Admitting: Internal Medicine

## 2020-11-26 DIAGNOSIS — J069 Acute upper respiratory infection, unspecified: Secondary | ICD-10-CM

## 2020-11-26 DIAGNOSIS — R059 Cough, unspecified: Secondary | ICD-10-CM | POA: Diagnosis not present

## 2020-11-26 DIAGNOSIS — R053 Chronic cough: Secondary | ICD-10-CM | POA: Diagnosis not present

## 2020-11-26 MED ORDER — BENZONATATE 100 MG PO CAPS: 100.0000 mg | ORAL_CAPSULE | Freq: Three times a day (TID) | ORAL | 0 refills | Status: DC | PRN

## 2020-11-26 MED ORDER — PREDNISONE 20 MG PO TABS: 40.0000 mg | ORAL_TABLET | Freq: Every day | ORAL | 0 refills | Status: AC

## 2020-11-26 NOTE — Discharge Instructions (Addendum)
Your x-ray was negative.  You have been prescribed prednisone steroid to decrease inflammation in chest and help with cough.  You have also been prescribed a cough medication to take as needed.  Please go to the hospital if shortness of breath develops.  Follow-up with urgent care or primary care physician if symptoms persist.

## 2020-11-26 NOTE — ED Provider Notes (Signed)
[facility]    CSN: [id number] Arrival date & time: 11/26/20  1053      History   Chief Complaint Chief Complaint  Patient presents with   Cough    HPI Mason Owens is a 25 y.o. male.   Patient presents with persistent cough that has been present for 2 weeks.  Also has some runny nose.  Denies any known fevers or sick contacts.  Denies chest pain or shortness of breath.  Patient does smoke unfiltered cigarettes daily.  Denies history of asthma but does have history of multiple episodes of bronchitis especially when seasons change.   Cough  Past Medical History:  Diagnosis Date   Bipolar disorder [facility])     Patient Active Problem List   Diagnosis Date Noted   Tobacco abuse 11/16/2014   ADHD (attention deficit hyperactivity disorder), inattentive type 07/29/2014   Depression, major, recurrent, moderate (HCC) 07/13/2014    Class: Chronic   Bipolar affective disorder (HCC) 07/06/2014   Marijuana abuse 07/06/2014    History reviewed. No pertinent surgical history.     Home Medications    Prior to Admission medications   Medication Sig Start Date End Date Taking? Authorizing Provider  benzonatate (TESSALON) 100 MG capsule Take 1 capsule (100 mg total) by mouth every 8 (eight) hours as needed for cough. 11/26/20  Yes Lance Muss, FNP  predniSONE (DELTASONE) 20 MG tablet Take 2 tablets (40 mg total) by mouth daily for 5 days. 11/26/20 12/01/20 Yes Lance Muss, FNP  ARIPiprazole (ABILIFY) 20 MG tablet Take 1/2  tablet daily for 2 weeks then 1 tablet daily 11/15/20   Cottle, Steva Ready., MD  buPROPion (WELLBUTRIN XL) 150 MG 24 hr tablet Take 1 tab daily for 7 days then 2 tabs daily. 11/15/20   Lauraine Rinne., MD  buPROPion (WELLBUTRIN XL) 300 MG 24 hr tablet TAKE 1 TABLET BY MOUTH EVERY DAY 08/25/19   Cottle, Steva Ready., MD  cloNIDine (CATAPRES) 0.1 MG tablet Take 1 tablet (0.1 mg total) by mouth at bedtime. 07/14/20   Cottle, Steva Ready., MD   dexmethylphenidate (FOCALIN XR) 10 MG 24 hr capsule Take 1 capsule (10 mg total) by mouth daily. Patient not taking: No sig reported 08/27/19   Cottle, Steva Ready., MD  dexmethylphenidate (FOCALIN XR) 10 MG 24 hr capsule Take 1 capsule (10 mg total) by mouth daily. Patient not taking: No sig reported 09/24/19   Cottle, Steva Ready., MD  dexmethylphenidate (FOCALIN XR) 10 MG 24 hr capsule Take 1 capsule (10 mg total) by mouth every morning. Patient not taking: No sig reported 01/18/20   Cottle, Steva Ready., MD  divalproex (DEPAKOTE) 500 MG DR tablet Take 1 tablet daily for 5 days then add 1 tablet daily until you reach a total of 4 daily. 11/15/20   Cottle, Steva Ready., MD    Family History Family History  Problem Relation Age of Onset   ADD / ADHD Father    Depression Father    Anxiety disorder Brother    Depression Maternal Grandmother    Anxiety disorder Maternal Grandmother     Social History Social History   Tobacco Use   Smoking status: Every Day    Packs/day: 0.50    Types: Cigarettes   Smokeless tobacco: Never  Substance Use Topics   Alcohol use: Yes    Alcohol/week: 4.0 - 5.0 standard drinks    Types: 4 - 5 Cans of  beer per week    Comment: all types of alcohol drinks   Drug use: Yes    Types: Marijuana    Comment: marijuana daily     Allergies   Patient has no known allergies.   Review of Systems Review of Systems Per HPI  Physical Exam Triage Vital Signs ED Triage Vitals [11/26/20 1146]  Enc Vitals Group     BP 126/79     Pulse Rate (!) 59     Resp 16     Temp 97.7 F (36.5 C)     Temp Source Oral     SpO2 97 %     Weight      Height      Head Circumference      Peak Flow      Pain Score 0     Pain Loc      Pain Edu?      Excl. in GC?    No data found.  Updated Vital Signs BP 126/79 (BP Location: Left Arm)   Pulse (!) 59   Temp 97.7 F (36.5 C) (Oral)   Resp 16   SpO2 97%   Visual Acuity Right Eye Distance:   Left Eye Distance:    Bilateral Distance:    Right Eye Near:   Left Eye Near:    Bilateral Near:     Physical Exam Constitutional:      General: He is not in acute distress.    Appearance: Normal appearance. He is not toxic-appearing.  HENT:     Head: Normocephalic and atraumatic.     Right Ear: Tympanic membrane and ear canal normal.     Left Ear: Tympanic membrane and ear canal normal.     Nose: Congestion present.     Mouth/Throat:     Mouth: Mucous membranes are moist.     Pharynx: No posterior oropharyngeal erythema.  Eyes:     Extraocular Movements: Extraocular movements intact.     Conjunctiva/sclera: Conjunctivae normal.     Pupils: Pupils are equal, round, and reactive to light.  Cardiovascular:     Rate and Rhythm: Normal rate and regular rhythm.     Pulses: Normal pulses.     Heart sounds: Normal heart sounds.  Pulmonary:     Effort: Pulmonary effort is normal. No respiratory distress.     Breath sounds: Normal breath sounds. No rhonchi.  Abdominal:     General: Abdomen is flat. Bowel sounds are normal.     Palpations: Abdomen is soft.  Musculoskeletal:        General: Normal range of motion.     Cervical back: Normal range of motion.  Skin:    General: Skin is warm and dry.  Neurological:     General: No focal deficit present.     Mental Status: He is alert and oriented to person, place, and time. Mental status is at baseline.  Psychiatric:        Mood and Affect: Mood normal.        Behavior: Behavior normal.     UC Treatments / Results  Labs (all labs ordered are listed, but only abnormal results are displayed) Labs Reviewed - No data to display  EKG   Radiology DG Chest 2 View  Result Date: 11/26/2020 CLINICAL DATA:  persistent cough EXAM: CHEST - 2 VIEW COMPARISON:  January 26, 2015 FINDINGS: Evaluation is limited secondary to technique. The cardiomediastinal silhouette is normal in contour. No pleural effusion. No pneumothorax. No  acute pleuroparenchymal  abnormality. Visualized abdomen is unremarkable. Mild dextrocurvature of the lower thoracic spine. Levocurvature of the visualized lumbar spine. IMPRESSION: No acute cardiopulmonary abnormality. Electronically Signed   By: Meda Klinefelter M.D.   On: 11/26/2020 12:26    Procedures Procedures (including critical care time)  Medications Ordered in UC Medications - No data to display  Initial Impression / Assessment and Plan / UC Course  I have reviewed the triage vital signs and the nursing notes.  Pertinent labs & imaging results that were available during my care of the patient were reviewed by me and considered in my medical decision making (see chart for details).     Chest x-ray was negative for any acute cardiopulmonary process.  Differential diagnoses include viral respiratory infection or viral bronchitis.  Will treat with prednisone x5 days.  Unable to prescribe albuterol due to interaction that has with Focalin.  Benzonatate to take as needed for cough.  Patient to follow-up with PCP or urgent care if symptoms persist.  Advised patient to go to the hospital if shortness of breath develops.Discussed strict return precautions. Patient verbalized understanding and is agreeable with plan.  Final Clinical Impressions(s) / UC Diagnoses   Final diagnoses:  Viral upper respiratory infection  Persistent cough     Discharge Instructions      Your x-ray was negative.  You have been prescribed prednisone steroid to decrease inflammation in chest and help with cough.  You have also been prescribed a cough medication to take as needed.  Please go to the hospital if shortness of breath develops.  Follow-up with urgent care or primary care physician if symptoms persist.     ED Prescriptions     Medication Sig Dispense Auth. Provider   predniSONE (DELTASONE) 20 MG tablet Take 2 tablets (40 mg total) by mouth daily for 5 days. 10 tablet Lance Muss, FNP   benzonatate (TESSALON) 100 MG  capsule Take 1 capsule (100 mg total) by mouth every 8 (eight) hours as needed for cough. 21 capsule Lance Muss, FNP      PDMP not reviewed this encounter.   Lance Muss, FNP 11/26/20 1254

## 2020-11-26 NOTE — ED Triage Notes (Signed)
Noticed a worsening cough over the last two weeks. Hx of heavy smoking of cigarettes and a vape pen. States he usually gets bronchitis when the seasons change.

## 2020-12-14 ENCOUNTER — Telehealth: Payer: Self-pay | Admitting: Psychiatry

## 2020-12-14 NOTE — Telephone Encounter (Signed)
Pt called requesting Rx for Bupropion XL now taking 300 mg daily. Only has 2 left for to night [facility] [address] Rx was for #60 not sure if got #60 or not. Contact Pt # [phone]

## 2020-12-15 ENCOUNTER — Other Ambulatory Visit: Payer: Self-pay

## 2020-12-15 DIAGNOSIS — F900 Attention-deficit hyperactivity disorder, predominantly inattentive type: Secondary | ICD-10-CM

## 2020-12-15 DIAGNOSIS — F3162 Bipolar disorder, current episode mixed, moderate: Secondary | ICD-10-CM

## 2020-12-15 MED ORDER — BUPROPION HCL ER (XL) 300 MG PO TB24: 300.0000 mg | ORAL_TABLET | Freq: Every day | ORAL | 0 refills | Status: DC

## 2020-12-15 NOTE — Telephone Encounter (Signed)
Rx sent for 300 mg Bupropion XL

## 2020-12-18 ENCOUNTER — Other Ambulatory Visit: Payer: Self-pay | Admitting: Psychiatry

## 2020-12-18 DIAGNOSIS — F3162 Bipolar disorder, current episode mixed, moderate: Secondary | ICD-10-CM

## 2020-12-27 ENCOUNTER — Other Ambulatory Visit: Payer: Self-pay | Admitting: Psychiatry

## 2020-12-27 ENCOUNTER — Telehealth: Payer: Self-pay | Admitting: Psychiatry

## 2020-12-27 NOTE — Telephone Encounter (Signed)
Next appt 11/15

## 2020-12-27 NOTE — Telephone Encounter (Signed)
Pt called and stated he was taking Buproprion, Abilifi and Depakote at night.  He feels drowsy during the day.  He stopped taking them for the last couple days.  Wonders if he should be taking them during the day.  Pls confirm "timing" of when meds should be taken.

## 2020-12-27 NOTE — Telephone Encounter (Signed)
Pt informed

## 2020-12-27 NOTE — Telephone Encounter (Signed)
Depakote should be taken at night.  Wellbutrin should be taking in the morning because it can interfere with sleep.  Abilify can be taken either morning or evening whichever he prefers.

## 2020-12-27 NOTE — Telephone Encounter (Signed)
I called the pt and he stated he was taking the meds at night,but since the bottle says take daily he switched them to the morning time,and it caused him to be drowsy during the day.What time should he be taking these meds?

## 2020-12-29 ENCOUNTER — Other Ambulatory Visit: Payer: Self-pay | Admitting: Psychiatry

## 2020-12-29 DIAGNOSIS — F3162 Bipolar disorder, current episode mixed, moderate: Secondary | ICD-10-CM

## 2021-01-17 ENCOUNTER — Ambulatory Visit: Payer: 59 | Admitting: Psychiatry

## 2021-02-15 ENCOUNTER — Other Ambulatory Visit: Payer: Self-pay | Admitting: Psychiatry

## 2021-02-15 DIAGNOSIS — F3162 Bipolar disorder, current episode mixed, moderate: Secondary | ICD-10-CM

## 2021-02-15 NOTE — Telephone Encounter (Signed)
Last seen 5/12 due back at 6 weeks

## 2021-02-15 NOTE — Telephone Encounter (Signed)
Pt has an appt on 1/31 °

## 2021-02-15 NOTE — Telephone Encounter (Signed)
Please schedule appt

## 2021-03-12 ENCOUNTER — Other Ambulatory Visit: Payer: Self-pay | Admitting: Psychiatry

## 2021-03-12 DIAGNOSIS — F3162 Bipolar disorder, current episode mixed, moderate: Secondary | ICD-10-CM

## 2021-03-12 DIAGNOSIS — F900 Attention-deficit hyperactivity disorder, predominantly inattentive type: Secondary | ICD-10-CM

## 2021-04-04 ENCOUNTER — Ambulatory Visit (INDEPENDENT_AMBULATORY_CARE_PROVIDER_SITE_OTHER): Payer: 59 | Admitting: Psychiatry

## 2021-04-04 ENCOUNTER — Encounter: Payer: Self-pay | Admitting: Psychiatry

## 2021-04-04 ENCOUNTER — Other Ambulatory Visit: Payer: Self-pay

## 2021-04-04 DIAGNOSIS — F411 Generalized anxiety disorder: Secondary | ICD-10-CM

## 2021-04-04 DIAGNOSIS — G4750 Parasomnia, unspecified: Secondary | ICD-10-CM | POA: Diagnosis not present

## 2021-04-04 DIAGNOSIS — F900 Attention-deficit hyperactivity disorder, predominantly inattentive type: Secondary | ICD-10-CM

## 2021-04-04 DIAGNOSIS — F3162 Bipolar disorder, current episode mixed, moderate: Secondary | ICD-10-CM

## 2021-04-04 MED ORDER — ARIPIPRAZOLE 20 MG PO TABS: ORAL_TABLET | ORAL | 1 refills | Status: DC

## 2021-04-04 MED ORDER — BUPROPION HCL ER (XL) 300 MG PO TB24: 300.0000 mg | ORAL_TABLET | Freq: Every day | ORAL | 1 refills | Status: DC

## 2021-04-04 MED ORDER — DIVALPROEX SODIUM 500 MG PO DR TAB: DELAYED_RELEASE_TABLET | ORAL | 1 refills | Status: DC

## 2021-04-04 MED ORDER — DEXMETHYLPHENIDATE HCL ER 10 MG PO CP24: 10.0000 mg | ORAL_CAPSULE | Freq: Every day | ORAL | 0 refills | Status: DC

## 2021-04-04 NOTE — Patient Instructions (Signed)
[facility]

## 2021-04-04 NOTE — Progress Notes (Signed)
RIGGEN RABA [id number] [birth year] 26 y.o.    Subjective:   Patient ID:  Mason Owens is a 26 y.o. (DOB 16-Oct-1995) male.  Chief Complaint:  Chief Complaint  Patient presents with   Follow-up    Bipolar 1 disorder, mixed, moderate (HCC)   ADHD   Anxiety    Depression        Associated symptoms include decreased concentration and myalgias.  Associated symptoms include no suicidal ideas. Mason Owens presents to the office today for follow-up of above.  seen December 25, 2018.  Depakote was increased from 1500 to 2000 mg daily because of mixed symptoms.  Work in appt to evaluate outcome of the change.  GF noticed no change.  He feels a little more control some days but not daily.  A lot of days very labile.  This week in a funk and procrastinating.  Yesterday easily overwhelmed by simple things.  Feels he needs to  Continue emotional growth in therapy also.  seen November 2020.  Valproic acid CBC checked and noted.  No med changes are made.   seen 06/02/2019 with the following noted: Honestly still a mess and not able to pick himself up.  Crappy drug and no motivation and just getting by.  Lately worse anxiety.  In his head too much and reacting to stress.   Isolated.  Coworker not a good influence.  Clouded thoughts with anxiety.  Moved to [employer] [place].  Life events happened.  Moved in with [name] of 2y.  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   Started hard physically intense job and got to the end of himself.  Still feels out of control mentally.  Quit the job 2 weeks ago.  Not doing healthy things right now.  Ruminating thoughts with a lot of time. Anxiety throwing him into mood swings.  Jumping from one thing to another.  Easily overwhelmed emotionally. Smokes weed a bit too much as a coping mechanism.  Nothing to do and gets stoned and then more negative thinking.   Because of persistent symptoms the plan was to wean Latuda and start Vraylar 1.5 mg daily  for bipolar depression.  However insurance would not cover it so we switched to Abilify 10 mg daily.  There was also concern that the patient will eventually lose his insurance and he needed to be on a generic  07/30/19 with the following noted: No change with Abilify much.  Life in general sucks.  He thinks that may be a drag on his mood as well.  Things are not working out in [place] as he had hoped. Started a new job and still needs his Focalin but is out.  He has been compliant with medications.  No side effects with Abilify.  Sleeping all right except complaining of some back pain and asked if there is anything I could do to help with that. Plan: We will use gabapentin off label for anxiety and bipolar disorder irritability which could also help with his back pain.  Discussed specifically that I am not making any diagnosis of the type of back pain that he has given that I cannot make a physical exam at this time but he believes it is related to overworking.  The back pain is interfering with his sleep.  Gabapentin could potentially serve several purposes at this point.  Emphasized that his back pain gets worse or if he starts getting sciatica symptoms that he needs to see a physician  and have a physical exam.  He agrees discussed side effects about gabapentin. Start gabapentin 300 mg 3 times daily.  05/16/2020 appointment with the following noted: Seen with mother [name] A lot of ups and downs. Rel in [place] ended and back home since September. Was self medicating with cannabis keeping me in a funk for awhile and has cut back.  A lot of emotional ups and downs with GF passed.  Last several months are harder.  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  Would like to get reevaluated for mental health things.  Pretty anxious and some OCD tendencies.  I feel crazy but I don't think so.  Wants a label.  Seeing a therapist just started, [name], LCSW, [place] including addictions. Does have  health insurance right now.    Sleep 8 hours and good usually.   Hx gabapentin 300 TID with some benefit but had withdrawal pain if missed it. [name] noticed some periods of depression and too down to be motivated to do something differently.  Was using a lot of THC in [place].  A lot of anxiety and beating himself up.  Some irritability with family. Often remorseful.   Plan: No side effects or particular benefit from Abilify 10 mg.  Therefore continue 15 mg which is a more typical mood stabilizing dose especially in a young healthy person.  He agrees to this plan looking for improvement in irritability and depression and anxiety. Continue Depakote 2000 mg daily.  His symptoms worsened whenever he tried to wean this medicine  07/14/2020 appointment with the following noted: Not noticed too much.   Still  Going through a lot of things with on again and off again relationship with GF.  Weed use varies.  No real changes with increase Abilify to 15 mg  Good or bad.  Someone told him he's "really violent" in his sleep.   Continues Wellbutrin xl 300 AM and Depakote DR 1500 HS. Vraylar coverage is not good with insurance.  04/04/2021 appointment with the following noted: Has missed several appointments. Inconsistent with meds.  Forgets if splits dose.  Needs to take it at night. Recognizes ambivalence about meds and sometimes just doesn't take and then he feels worse.  Not sure if it is mania that causes it. Meds work well if reasonably consistent.   Got a raise and some good things happened. Hard breakup in November after living together an smoked a lot of weed.   Lives alone.  Does things with couple of friends and doesn't reach out like he should. Uses Focalin prn like when has double shifts. Sleep is better but always thrashed some in his sleep.  No NM and feels rested. Didn't like last therapist who only wanted to focus on his pot use. No I need it but not sure he's willing.  Patient denies  difficulty with sleep initiation or maintenance. Denies appetite disturbance.  Patient reports that energy and motivation have been good.  Concentration impaired when emotionally upset.  Periods of agitation both internally and behaviorally.  Patient denies any suicidal ideation.  Past Psychiatric Medication Trials: Depakote ER, Tegretol,  lamotrigine, lithium 1200 no response, Latuda 120, perphenazine 12, Abilify 15  Wellbutrin, sertraline no response, Pristiq 50,  Focalin, Ritalin , Concerta crash, Nuvigil, Intuniv side effects Seen in this office since 2008  Review of Systems:  Review of Systems  Cardiovascular:  Negative for chest pain and palpitations.  Musculoskeletal:  Positive for back pain and myalgias.  Neurological:  Negative for dizziness.  Psychiatric/Behavioral:  Positive for decreased concentration and dysphoric mood. Negative for agitation, behavioral problems, confusion, hallucinations, self-injury, sleep disturbance and suicidal ideas. The patient is nervous/anxious. The patient is not hyperactive.    Medications: I have reviewed the patient's current medications.  Current Outpatient Medications  Medication Sig Dispense Refill   dexmethylphenidate (FOCALIN XR) 10 MG 24 hr capsule Take 1 capsule (10 mg total) by mouth daily. 30 capsule 0   dexmethylphenidate (FOCALIN XR) 10 MG 24 hr capsule Take 1 capsule (10 mg total) by mouth every morning. 30 capsule 0   ARIPiprazole (ABILIFY) 20 MG tablet TAKE 1 TABLET BY MOUTH EVERY DAY 90 tablet 1   buPROPion (WELLBUTRIN XL) 300 MG 24 hr tablet Take 1 tablet (300 mg total) by mouth daily. 90 tablet 1   dexmethylphenidate (FOCALIN XR) 10 MG 24 hr capsule Take 1 capsule (10 mg total) by mouth daily. 30 capsule 0   divalproex (DEPAKOTE) 500 MG DR tablet TAKE 4 TABLETS BY MOUTH EVERY DAY 360 tablet 1   No current facility-administered medications for this visit.    Medication Side Effects: None  Allergies: No Known Allergies  Past  Medical History:  Diagnosis Date   Bipolar disorder ([place])     Family History  Problem Relation Age of Onset   ADD / ADHD Father    Depression Father    Anxiety disorder Brother    Depression Maternal Grandmother    Anxiety disorder Maternal Grandmother     Social History   Socioeconomic History   Marital status: Single    Spouse name: Not on file   Number of children: Not on file   Years of education: Not on file   Highest education level: Not on file  Occupational History   Not on file  Tobacco Use   Smoking status: Every Day    Packs/day: 0.50    Types: Cigarettes   Smokeless tobacco: Never  Substance and Sexual Activity   Alcohol use: Yes    Alcohol/week: 4.0 - 5.0 standard drinks    Types: 4 - 5 Cans of beer per week    Comment: all types of alcohol drinks   Drug use: Yes    Types: Marijuana    Comment: marijuana daily   Sexual activity: Yes    Birth control/protection: Condom  Other Topics Concern   Not on file  Social History Narrative   Not on file   Social Determinants of Health   Financial Resource Strain: Not on file  Food Insecurity: Not on file  Transportation Needs: Not on file  Physical Activity: Not on file  Stress: Not on file  Social Connections: Not on file  Intimate Partner Violence: Not on file    Past Medical History, Surgical history, Social history, and Family history were reviewed and updated as appropriate.   Please see review of systems for further details on the patient's review from today.   Objective:   Physical Exam:  There were no vitals taken for this visit.  Physical Exam Constitutional:      General: He is not in acute distress. Musculoskeletal:        General: No deformity.  Neurological:     Mental Status: He is alert and oriented to person, place, and time.     Cranial Nerves: No dysarthria.     Coordination: Coordination normal.  Psychiatric:        Attention and Perception: Attention and perception  normal. He does not  perceive auditory or visual hallucinations.        Mood and Affect: Mood is anxious and depressed. Affect is not labile, blunt, angry or inappropriate.        Speech: Speech normal.        Behavior: Behavior normal. Behavior is cooperative.        Thought Content: Thought content normal. Thought content is not paranoid or delusional. Thought content does not include homicidal or suicidal ideation. Thought content does not include suicidal plan.        Cognition and Memory: Cognition and memory normal.        Judgment: Judgment normal.     Comments: Fair insight and judgment. A bit less mixed dysphoric manic agitation and racing thoughts.  cycles    Lab Review:     Component Value Date/Time   NA 141 07/06/2014 1458   K 4.2 07/06/2014 1458   CL 104 07/06/2014 1458   CO2 27 07/06/2014 1458   GLUCOSE 81 07/06/2014 1458   BUN 12 07/06/2014 1458   CREATININE 0.70 07/06/2014 1458   CALCIUM 9.4 07/06/2014 1458   PROT 7.2 07/06/2014 1458   ALBUMIN 4.7 07/06/2014 1458   AST 18 07/06/2014 1458   ALT 8 07/06/2014 1458   ALKPHOS 44 07/06/2014 1458   BILITOT 0.5 07/06/2014 1458       Component Value Date/Time   WBC 4.7 01/23/2019 0000   RBC 4.66 01/23/2019 0000   HGB 14.4 01/23/2019 0000   HCT 43.3 01/23/2019 0000   PLT 167 01/23/2019 0000   MCV 92.9 01/23/2019 0000   MCH 30.9 01/23/2019 0000   MCHC 33.3 01/23/2019 0000   RDW 12.5 01/23/2019 0000    No results found for: POCLITH, LITHIUM   Lab Results  Component Value Date   VALPROATE 119.8 (H) 01/23/2019     .res Assessment: Plan:    Treveion was seen today for follow-up, adhd and anxiety.  Diagnoses and all orders for this visit:  Bipolar 1 disorder, mixed, moderate (HCC) -     ARIPiprazole (ABILIFY) 20 MG tablet; TAKE 1 TABLET BY MOUTH EVERY DAY -     divalproex (DEPAKOTE) 500 MG DR tablet; TAKE 4 TABLETS BY MOUTH EVERY DAY -     buPROPion (WELLBUTRIN XL) 300 MG 24 hr tablet; Take 1 tablet (300 mg  total) by mouth daily.  Generalized anxiety disorder  Attention deficit hyperactivity disorder (ADHD), predominantly inattentive type -     buPROPion (WELLBUTRIN XL) 300 MG 24 hr tablet; Take 1 tablet (300 mg total) by mouth daily. -     dexmethylphenidate (FOCALIN XR) 10 MG 24 hr capsule; Take 1 capsule (10 mg total) by mouth daily.  Parasomnia, unspecified type     Greater than 50% of 45 minutes face to face time with patient was spent on counseling and coordination of care. We discussed Patient has had a relapse of bipolar disorder with mixed symptoms.    Missed several appointments from May 2022 until January 2023. Says he just forgot.   There is been no significant change in symptoms externally but mild improvement in behavior-year-old control internally from the increase in Depakote to 2000 mg or the switch from [place] to Abilify 10 mg.  Overall the benefit to the increase has been slight.  He would like better control.  He continues to have mixed symptoms of bipolar disorder plus anxiety.  Discussed potential metabolic side effects associated with atypical antipsychotics, as well as potential risk for movement side  effects. Advised pt to contact office if movement side effects occur.   He is aware to avoid THC in daytime with meds.  He admits he is smoking too much in the daytime and that that is probably not helpful.  He is trying to cut it back.  Continue Depakote to 2000 mg daily  Discussed side effects in detail.   Explained the fairly wide dosing range options.  Checked valproic acid level and CBC.  CBC unremarkable in November 2020.  Specifically platelet count 167 and normal WBC and hematocrit.  Valproic acid level slightly elevated at 120 which is considered acceptable in psychiatry.  No side effects or particular benefit from Abilify 1 mg.  Therefore increase to 20 mg..  He agrees to this plan looking for improvement in irritability and depression and anxiety. Option  change Vraylar.  But cost concerns.  Supportive therapy dealing with broken relationship and dealing with grief.  Continue therapy. CBT encourage it.  Continue Focalin XR 10 mg as needed ADD.  Fu 6 weeks  Lynder Parents, MD, DFAPA  Please see After Visit Summary for patient specific instructions.  No future appointments.  No orders of the defined types were placed in this encounter.     -------------------------------

## 2021-05-25 ENCOUNTER — Ambulatory Visit
Admission: EM | Admit: 2021-05-25 | Discharge: 2021-05-25 | Disposition: A | Discharge status: Home / Self Care | Payer: 59 | Attending: Physician Assistant | Admitting: Physician Assistant

## 2021-05-25 ENCOUNTER — Other Ambulatory Visit: Payer: Self-pay

## 2021-05-25 ENCOUNTER — Ambulatory Visit (INDEPENDENT_AMBULATORY_CARE_PROVIDER_SITE_OTHER): Payer: 59

## 2021-05-25 DIAGNOSIS — M549 Dorsalgia, unspecified: Secondary | ICD-10-CM

## 2021-05-25 DIAGNOSIS — R0782 Intercostal pain: Secondary | ICD-10-CM | POA: Diagnosis not present

## 2021-05-25 MED ORDER — CYCLOBENZAPRINE HCL 10 MG PO TABS: 10.0000 mg | ORAL_TABLET | Freq: Two times a day (BID) | ORAL | 0 refills | Status: DC | PRN

## 2021-05-25 MED ORDER — PREDNISONE 20 MG PO TABS: 40.0000 mg | ORAL_TABLET | Freq: Every day | ORAL | 0 refills | Status: AC

## 2021-05-25 NOTE — ED Provider Notes (Signed)
?[facility] ? ? ? ?CSN: [id number] ?Arrival date & time: 05/25/21  1305 ? ? ?  ? ?History   ?Chief Complaint ?Chief Complaint  ?Patient presents with  ? Back Pain  ? ? ?HPI ?Mason Owens is a 26 y.o. male.  ? ?Patient here today for evaluation of pain to his left side under his ribs that started after he ran into a side bar at a concession stand 4 days ago.  He reports that when he moves he has worsened pain.  He also notes that when he coughs pain has been worse.  He denies any shortness of breath.  He has not had any nausea, vomiting or other GI concerns.  He denies any blood in his stool or blood in his urine. ? ?The history is provided by the patient.  ? ?Past Medical History:  ?Diagnosis Date  ? Bipolar disorder (HCC)   ? ? ?Patient Active Problem List  ? Diagnosis Date Noted  ? Tobacco abuse 11/16/2014  ? ADHD (attention deficit hyperactivity disorder), inattentive type 07/29/2014  ? Depression, major, recurrent, moderate (HCC) 07/13/2014  ?  Class: Chronic  ? Bipolar affective disorder (HCC) 07/06/2014  ? Marijuana abuse 07/06/2014  ? ? ?History reviewed. No pertinent surgical history. ? ? ? ? ?Home Medications   ? ?Prior to Admission medications   ?Medication Sig Start Date End Date Taking? Authorizing Provider  ?cyclobenzaprine (FLEXERIL) 10 MG tablet Take 1 tablet (10 mg total) by mouth 2 (two) times daily as needed for muscle spasms. 05/25/21  Yes Tomi BambergerMyers, Omauri Boeve F, PA-C  ?predniSONE (DELTASONE) 20 MG tablet Take 2 tablets (40 mg total) by mouth daily with breakfast for 5 days. 05/25/21 05/30/21 Yes Tomi BambergerMyers, Elzora Cullins F, PA-C  ?ARIPiprazole (ABILIFY) 20 MG tablet TAKE 1 TABLET BY MOUTH EVERY DAY 04/04/21   Cottle, Steva Readyarey G Jr., MD  ?buPROPion (WELLBUTRIN XL) 300 MG 24 hr tablet Take 1 tablet (300 mg total) by mouth daily. 04/04/21   Cottle, Steva Readyarey G Jr., MD  ?dexmethylphenidate (FOCALIN XR) 10 MG 24 hr capsule Take 1 capsule (10 mg total) by mouth daily. 09/24/19   Cottle, Steva Readyarey G Jr., MD   ?dexmethylphenidate (FOCALIN XR) 10 MG 24 hr capsule Take 1 capsule (10 mg total) by mouth every morning. 01/18/20   Cottle, Steva Readyarey G Jr., MD  ?dexmethylphenidate (FOCALIN XR) 10 MG 24 hr capsule Take 1 capsule (10 mg total) by mouth daily. 04/04/21   Cottle, Steva Readyarey G Jr., MD  ?divalproex (DEPAKOTE) 500 MG DR tablet TAKE 4 TABLETS BY MOUTH EVERY DAY 04/04/21   Cottle, Steva Readyarey G Jr., MD  ? ? ?Family History ?Family History  ?Problem Relation Age of Onset  ? Healthy Mother   ? ADD / ADHD Father   ? Depression Father   ? Anxiety disorder Brother   ? Depression Maternal Grandmother   ? Anxiety disorder Maternal Grandmother   ? ? ?Social History ?Social History  ? ?Tobacco Use  ? Smoking status: Every Day  ?  Packs/day: 0.50  ?  Types: Cigarettes  ? Smokeless tobacco: Never  ?Substance Use Topics  ? Alcohol use: Yes  ?  Alcohol/week: 4.0 - 5.0 standard drinks  ?  Types: 4 - 5 Cans of beer per week  ?  Comment: all types of alcohol drinks  ? Drug use: Yes  ?  Types: Marijuana  ?  Comment: marijuana daily  ? ? ? ?Allergies   ?Patient has no known allergies. ? ? ?Review of Systems ?  Review of Systems  ?Constitutional:  Negative for chills and fever.  ?Eyes:  Negative for discharge and redness.  ?Gastrointestinal:  Negative for abdominal pain, blood in stool, nausea and vomiting.  ?Genitourinary:  Negative for flank pain and hematuria.  ?Musculoskeletal:  Positive for back pain and myalgias.  ?Neurological:  Negative for numbness.  ? ? ?Physical Exam ?Triage Vital Signs ?ED Triage Vitals  ?Enc Vitals Group  ?   BP   ?   Pulse   ?   Resp   ?   Temp   ?   Temp src   ?   SpO2   ?   Weight   ?   Height   ?   Head Circumference   ?   Peak Flow   ?   Pain Score   ?   Pain Loc   ?   Pain Edu?   ?   Excl. in GC?   ? ?No data found. ? ?Updated Vital Signs ?BP 124/73   Pulse 83   Temp 98.1 ?F (36.7 ?C)   Resp 19   SpO2 96%  ?   ? ?Physical Exam ?Vitals and nursing note reviewed.  ?Constitutional:   ?   General: He is not in acute  distress. ?   Appearance: Normal appearance. He is not ill-appearing.  ?HENT:  ?   Head: Normocephalic and atraumatic.  ?Eyes:  ?   Conjunctiva/sclera: Conjunctivae normal.  ?Cardiovascular:  ?   Rate and Rhythm: Normal rate.  ?Pulmonary:  ?   Effort: Pulmonary effort is normal. No respiratory distress.  ?Musculoskeletal:  ?   Comments: TTP noted to left lateral side, upper low back  ?Neurological:  ?   Mental Status: He is alert.  ?Psychiatric:     ?   Mood and Affect: Mood normal.     ?   Behavior: Behavior normal.     ?   Thought Content: Thought content normal.  ? ? ? ?UC Treatments / Results  ?Labs ?(all labs ordered are listed, but only abnormal results are displayed) ?Labs Reviewed - No data to display ? ?EKG ? ? ?Radiology ?DG Ribs Unilateral W/Chest Left ? ?Result Date: 05/25/2021 ?CLINICAL DATA:  Injury with left lower rib pain EXAM: LEFT RIBS AND CHEST - 3+ VIEW COMPARISON:  11/26/2020 FINDINGS: Heart and mediastinal shadows are normal. The lungs are clear. No pneumothorax or hemothorax. Marker put in place at the location of pain at the lower left ribs. No evidence of fracture. There is chronic scoliosis of the spine. IMPRESSION: No active cardiopulmonary disease. No rib fracture. Chronic scoliosis of the spine. Electronically Signed   By: Paulina Fusi M.D.   On: 05/25/2021 14:20   ? ?Procedures ?Procedures (including critical care time) ? ?Medications Ordered in UC ?Medications - No data to display ? ?Initial Impression / Assessment and Plan / UC Course  ?I have reviewed the triage vital signs and the nursing notes. ? ?Pertinent labs & imaging results that were available during my care of the patient were reviewed by me and considered in my medical decision making (see chart for details). ? ?  ?Xray without rib fracture. Patient is aware of reported scoliosis. Will treat with steroid burst and muscle relaxer and encouraged follow up if no gradual improvement or if symptoms worsen in any way.  ? ?Final  Clinical Impressions(s) / UC Diagnoses  ? ?Final diagnoses:  ?Acute left-sided back pain, unspecified back location  ? ?Discharge Instructions   ?  None ?  ? ?ED Prescriptions   ? ? Medication Sig Dispense Auth. Provider  ? predniSONE (DELTASONE) 20 MG tablet Take 2 tablets (40 mg total) by mouth daily with breakfast for 5 days. 10 tablet Tomi Bamberger, PA-C  ? cyclobenzaprine (FLEXERIL) 10 MG tablet Take 1 tablet (10 mg total) by mouth 2 (two) times daily as needed for muscle spasms. 20 tablet Tomi Bamberger, PA-C  ? ?  ? ?PDMP not reviewed this encounter. ?  ?Tomi Bamberger, PA-C ?05/25/21 1439 ? ?

## 2021-05-25 NOTE — ED Triage Notes (Signed)
Pt presents with complaints of running into the side of a bar of a concession stand on Sunday on the left side right under his ribs.  ? ?Reports he also works in [employer] where he does a lot of moving and twisting.  ? ?Pt is having continued pain with movement in that area.  ?

## 2021-05-26 ENCOUNTER — Other Ambulatory Visit: Payer: Self-pay

## 2021-05-26 ENCOUNTER — Telehealth: Payer: Self-pay | Admitting: Psychiatry

## 2021-05-26 DIAGNOSIS — F900 Attention-deficit hyperactivity disorder, predominantly inattentive type: Secondary | ICD-10-CM

## 2021-05-26 MED ORDER — DEXMETHYLPHENIDATE HCL ER 10 MG PO CP24: 10.0000 mg | ORAL_CAPSULE | Freq: Every day | ORAL | 0 refills | Status: DC

## 2021-05-26 MED ORDER — DEXMETHYLPHENIDATE HCL ER 10 MG PO CP24: 10.0000 mg | ORAL_CAPSULE | Freq: Every morning | ORAL | 0 refills | Status: DC

## 2021-05-26 NOTE — Telephone Encounter (Signed)
Pended.

## 2021-05-26 NOTE — Telephone Encounter (Signed)
Next visit is 10/02/21. Emmette called and said he has 2 Focalin left. He is requesting a refill on it called to: ? ?[id number] - [place], [place] - [address] ? ?Phone:  [phone]  ?Fax:  [phone]  ? ? ? ? ? ? ? ?

## 2021-10-02 ENCOUNTER — Ambulatory Visit (INDEPENDENT_AMBULATORY_CARE_PROVIDER_SITE_OTHER): Payer: 59 | Admitting: Psychiatry

## 2021-10-02 ENCOUNTER — Encounter: Payer: Self-pay | Admitting: Psychiatry

## 2021-10-02 DIAGNOSIS — F900 Attention-deficit hyperactivity disorder, predominantly inattentive type: Secondary | ICD-10-CM | POA: Diagnosis not present

## 2021-10-02 DIAGNOSIS — F3162 Bipolar disorder, current episode mixed, moderate: Secondary | ICD-10-CM | POA: Diagnosis not present

## 2021-10-02 MED ORDER — ARIPIPRAZOLE 20 MG PO TABS: ORAL_TABLET | ORAL | 1 refills | Status: DC

## 2021-10-02 MED ORDER — DIVALPROEX SODIUM 500 MG PO DR TAB: DELAYED_RELEASE_TABLET | ORAL | 1 refills | Status: DC

## 2021-10-02 MED ORDER — DEXMETHYLPHENIDATE HCL ER 10 MG PO CP24: 10.0000 mg | ORAL_CAPSULE | Freq: Every day | ORAL | 0 refills | Status: DC

## 2021-10-02 MED ORDER — DEXMETHYLPHENIDATE HCL ER 10 MG PO CP24: 10.0000 mg | ORAL_CAPSULE | Freq: Every morning | ORAL | 0 refills | Status: DC

## 2021-10-02 MED ORDER — BUPROPION HCL ER (XL) 300 MG PO TB24: 300.0000 mg | ORAL_TABLET | Freq: Every day | ORAL | 1 refills | Status: DC

## 2021-10-02 NOTE — Progress Notes (Signed)
Mason Owens [id number] 07-Feb-1996 26 y.o.    Subjective:   Patient ID:  Mason Owens is a 26 y.o. (DOB [birth year]) male.  Chief Complaint:  Chief Complaint  Patient presents with   Follow-up    Bipolar 1 disorder, mixed, moderate (HCC)   ADHD   Anxiety    Depression        Associated symptoms include decreased concentration.  Associated symptoms include no myalgias and no suicidal ideas.  Past medical history includes anxiety.   Anxiety Symptoms include decreased concentration. Patient reports no chest pain, confusion, dizziness, nervous/anxious behavior, palpitations or suicidal ideas.     Mason Owens presents to the office today for follow-up of above.  seen December 25, 2018.  Depakote was increased from 1500 to 2000 mg daily because of mixed symptoms.  Work in appt to evaluate outcome of the change.  GF noticed no change.  He feels a little more control some days but not daily.  A lot of days very labile.  This week in a funk and procrastinating.  Yesterday easily overwhelmed by simple things.  Feels he needs to  Continue emotional growth in therapy also.  seen November 2020.  Valproic acid CBC checked and noted.  No med changes are made.   seen 06/02/2019 with the following noted: Honestly still a mess and not able to pick himself up.  Crappy drug and no motivation and just getting by.  Lately worse anxiety.  In his head too much and reacting to stress.   Isolated.  Coworker not a good influence.  Clouded thoughts with anxiety.  Moved to [place].  Life events happened.  Moved in with Mason Owens of 2y.  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   Started hard physically intense job and got to the end of himself.  Still feels out of control mentally.  Quit the job 2 weeks ago.  Not doing healthy things right now.  Ruminating thoughts with a lot of time. Anxiety throwing him into mood swings.  Jumping from one thing to another.  Easily overwhelmed  emotionally. Smokes weed a bit too much as a coping mechanism.  Nothing to do and gets stoned and then more negative thinking.   Because of persistent symptoms the plan was to wean Latuda and start Vraylar 1.5 mg daily for bipolar depression.  However insurance would not cover it so we switched to Abilify 10 mg daily.  There was also concern that the patient will eventually lose his insurance and he needed to be on a generic  07/30/19 with the following noted: No change with Abilify much.  Life in general sucks.  He thinks that may be a drag on his mood as well.  Things are not working out in [place] as he had hoped. Started a new job and still needs his Focalin but is out.  He has been compliant with medications.  No side effects with Abilify.  Sleeping all right except complaining of some back pain and asked if there is anything I could do to help with that. Plan: We will use gabapentin off label for anxiety and bipolar disorder irritability which could also help with his back pain.  Discussed specifically that I am not making any diagnosis of the type of back pain that he has given that I cannot make a physical exam at this time but he believes it is related to overworking.  The back pain is interfering with his sleep.  Gabapentin could potentially serve several purposes at this point.  Emphasized that his back pain gets worse or if he starts getting sciatica symptoms that he needs to see a physician and have a physical exam.  He agrees discussed side effects about gabapentin. Start gabapentin 300 mg 3 times daily.  05/16/2020 appointment with the following noted: Seen with mother Mason Owens A lot of ups and downs. Rel in [place] ended and back home since September. Was self medicating with cannabis keeping me in a funk for awhile and has cut back.  A lot of emotional ups and downs with GF passed.  Last several months are harder.  Work for [employer], [employer] but varies with frequency.  Would like to get  reevaluated for mental health things.  Pretty anxious and some OCD tendencies.  I feel crazy but I don't think so.  Wants a label.  Seeing a therapist just started, Mason Owens, Mason Owens, [place] including addictions. Does have health insurance right now.    Sleep 8 hours and good usually.   Hx gabapentin 300 TID with some benefit but had withdrawal pain if missed it. Mason Owens noticed some periods of depression and too down to be motivated to do something differently.  Was using a lot of THC in [place].  A lot of anxiety and beating himself up.  Some irritability with family. Often remorseful.   Plan: No side effects or particular benefit from Abilify 10 mg.  Therefore continue 15 mg which is a more typical mood stabilizing dose especially in a young healthy person.  He agrees to this plan looking for improvement in irritability and depression and anxiety. Continue Depakote 2000 mg daily.  His symptoms worsened whenever he tried to wean this medicine  07/14/2020 appointment with the following noted: Not noticed too much.   Still  Going through a lot of things with on again and off again relationship with GF.  Weed use varies.  No real changes with increase Abilify to 15 mg  Good or bad.  Someone told him he's "really violent" in his sleep.   Continues Wellbutrin xl 300 AM and Depakote DR 1500 HS. Vraylar coverage is not good with insurance.  04/04/2021 appointment with the following noted: Has missed several appointments. Inconsistent with meds.  Forgets if splits dose.  Needs to take it at night. Recognizes ambivalence about meds and sometimes just doesn't take and then he feels worse.  Not sure if it is mania that causes it. Meds work well if reasonably consistent.   Got a raise and some good things happened. Hard breakup in November after living together an smoked a lot of weed.   Lives alone.  Does things with couple of friends and doesn't reach out like he should. Uses Focalin prn like when has  double shifts. Sleep is better but always thrashed some in his sleep.  No NM and feels rested. Didn't like last therapist who only wanted to focus on his pot use. No I need it but not sure he's willing.  10/02/21 appt noted: Pretty well.  Mental health not too bad.  Mood swings not serious.  More managed but still has some of them.  Not a lot of stressors right now. Satisfied with current meds.   No SE.   Health insurance change.    Patient denies difficulty with sleep initiation or maintenance. Denies appetite disturbance.  Patient reports that energy and motivation have been good.  Concentration impaired when emotionally upset.  Periods of agitation both internally  and behaviorally.  Patient denies any suicidal ideation.  Past Psychiatric Medication Trials: Depakote ER, Tegretol,  lamotrigine, lithium 1200 no response, Latuda 120, perphenazine 12, Abilify 15  Wellbutrin, sertraline no response, Pristiq 50,  Focalin, Ritalin , Concerta crash, Nuvigil, Intuniv side effects Seen in this office since 2008  Review of Systems:  Review of Systems  Cardiovascular:  Negative for chest pain and palpitations.  Musculoskeletal:  Negative for back pain and myalgias.  Neurological:  Negative for dizziness.  Psychiatric/Behavioral:  Positive for decreased concentration. Negative for agitation, behavioral problems, confusion, dysphoric mood, hallucinations, self-injury, sleep disturbance and suicidal ideas. The patient is not nervous/anxious and is not hyperactive.     Medications: I have reviewed the patient's current medications.  Current Outpatient Medications  Medication Sig Dispense Refill   cyclobenzaprine (FLEXERIL) 10 MG tablet Take 1 tablet (10 mg total) by mouth 2 (two) times daily as needed for muscle spasms. 20 tablet 0   ARIPiprazole (ABILIFY) 20 MG tablet TAKE 1 TABLET BY MOUTH EVERY DAY 90 tablet 1   buPROPion (WELLBUTRIN XL) 300 MG 24 hr tablet Take 1 tablet (300 mg total) by mouth  daily. 90 tablet 1   [START ON 11/27/2021] dexmethylphenidate (FOCALIN XR) 10 MG 24 hr capsule Take 1 capsule (10 mg total) by mouth daily. 30 capsule 0   [START ON 10/30/2021] dexmethylphenidate (FOCALIN XR) 10 MG 24 hr capsule Take 1 capsule (10 mg total) by mouth every morning. 30 capsule 0   dexmethylphenidate (FOCALIN XR) 10 MG 24 hr capsule Take 1 capsule (10 mg total) by mouth daily. 30 capsule 0   divalproex (DEPAKOTE) 500 MG DR tablet TAKE 4 TABLETS BY MOUTH EVERY DAY 360 tablet 1   No current facility-administered medications for this visit.    Medication Side Effects: None  Allergies: No Known Allergies  Past Medical History:  Diagnosis Date   Bipolar disorder (HCC)     Family History  Problem Relation Age of Onset   Healthy Mother    ADD / ADHD Father    Depression Father    Anxiety disorder Brother    Depression Maternal Grandmother    Anxiety disorder Maternal Grandmother     Social History   Socioeconomic History   Marital status: Single    Spouse name: Not on file   Number of children: Not on file   Years of education: Not on file   Highest education level: Not on file  Occupational History   Not on file  Tobacco Use   Smoking status: Every Day    Packs/day: 0.50    Types: Cigarettes   Smokeless tobacco: Never  Substance and Sexual Activity   Alcohol use: Yes    Alcohol/week: 4.0 - 5.0 standard drinks of alcohol    Types: 4 - 5 Cans of beer per week    Comment: all types of alcohol drinks   Drug use: Yes    Types: Marijuana    Comment: marijuana daily   Sexual activity: Yes    Birth control/protection: Condom  Other Topics Concern   Not on file  Social History Narrative   Not on file   Social Determinants of Health   Financial Resource Strain: Not on file  Food Insecurity: Not on file  Transportation Needs: Not on file  Physical Activity: Not on file  Stress: Not on file  Social Connections: Not on file  Intimate Partner Violence: Not on  file    Past Medical History, Surgical history, Social history,  and Family history were reviewed and updated as appropriate.   Please see review of systems for further details on the patient's review from today.   Objective:   Physical Exam:  There were no vitals taken for this visit.  Physical Exam Constitutional:      General: He is not in acute distress. Musculoskeletal:        General: No deformity.  Neurological:     Mental Status: He is alert and oriented to person, place, and time.     Cranial Nerves: No dysarthria.     Coordination: Coordination normal.  Psychiatric:        Attention and Perception: Attention and perception normal. He does not perceive auditory or visual hallucinations.        Mood and Affect: Mood is not anxious or depressed. Affect is not labile, blunt, angry or inappropriate.        Speech: Speech normal.        Behavior: Behavior normal. Behavior is cooperative.        Thought Content: Thought content normal. Thought content is not paranoid or delusional. Thought content does not include homicidal or suicidal ideation. Thought content does not include suicidal plan.        Cognition and Memory: Cognition and memory normal.        Judgment: Judgment normal.     Comments: Fair insight and judgment. Better mood stability.     Lab Review:     Component Value Date/Time   NA 141 07/06/2014 1458   K 4.2 07/06/2014 1458   CL 104 07/06/2014 1458   CO2 27 07/06/2014 1458   GLUCOSE 81 07/06/2014 1458   BUN 12 07/06/2014 1458   CREATININE 0.70 07/06/2014 1458   CALCIUM 9.4 07/06/2014 1458   PROT 7.2 07/06/2014 1458   ALBUMIN 4.7 07/06/2014 1458   AST 18 07/06/2014 1458   ALT 8 07/06/2014 1458   ALKPHOS 44 07/06/2014 1458   BILITOT 0.5 07/06/2014 1458       Component Value Date/Time   WBC 4.7 01/23/2019 0000   RBC 4.66 01/23/2019 0000   HGB 14.4 01/23/2019 0000   HCT 43.3 01/23/2019 0000   PLT 167 01/23/2019 0000   MCV 92.9 01/23/2019 0000    MCH 30.9 01/23/2019 0000   MCHC 33.3 01/23/2019 0000   RDW 12.5 01/23/2019 0000    No results found for: "POCLITH", "LITHIUM"   Lab Results  Component Value Date   VALPROATE 119.8 (H) 01/23/2019     .res Assessment: Plan:    Rydell was seen today for follow-up, adhd and anxiety.  Diagnoses and all orders for this visit:  Bipolar 1 disorder, mixed, moderate (HCC) -     ARIPiprazole (ABILIFY) 20 MG tablet; TAKE 1 TABLET BY MOUTH EVERY DAY -     buPROPion (WELLBUTRIN XL) 300 MG 24 hr tablet; Take 1 tablet (300 mg total) by mouth daily. -     divalproex (DEPAKOTE) 500 MG DR tablet; TAKE 4 TABLETS BY MOUTH EVERY DAY  Attention deficit hyperactivity disorder (ADHD), predominantly inattentive type -     buPROPion (WELLBUTRIN XL) 300 MG 24 hr tablet; Take 1 tablet (300 mg total) by mouth daily. -     dexmethylphenidate (FOCALIN XR) 10 MG 24 hr capsule; Take 1 capsule (10 mg total) by mouth daily. -     dexmethylphenidate (FOCALIN XR) 10 MG 24 hr capsule; Take 1 capsule (10 mg total) by mouth every morning. -     dexmethylphenidate (  FOCALIN XR) 10 MG 24 hr capsule; Take 1 capsule (10 mg total) by mouth daily.     Greater than 50% of 45 minutes face to face time with patient was spent on counseling and coordination of care. We discussed Patient has had a relapse of bipolar disorder with mixed symptoms.    Missed several appointments from May 2022 until January 2023. Says he just forgot.   There is been significant change in symptoms externally with inccrease Ability to 20 mg daily. No side effects or particular benefit from Abilify 20 mg .   Mood better with increase..   Discussed potential metabolic side effects associated with atypical antipsychotics, as well as potential risk for movement side effects. Advised pt to contact office if movement side effects occur.   He is aware to avoid THC in daytime with meds.  He admits he is smoking too much in the daytime and that that is  probably not helpful.  He is trying to cut it back.  Continue Depakote to 2000 mg daily  Discussed side effects in detail.   Explained the fairly wide dosing range options.  Checked valproic acid level and CBC.  CBC unremarkable in November 2020.  Specifically platelet count 167 and normal WBC and hematocrit.  Valproic acid level slightly elevated at 120 which is considered acceptable in psychiatry.  Supportive therapy dealing with broken relationship and dealing with grief.  Continue therapy. CBT encourage it.  Continue Focalin XR 10 mg as needed ADD. Discussed potential benefits, risks, and side effects of stimulants with patient to include increased heart rate, palpitations, insomnia, increased anxiety, increased irritability, or decreased appetite.  Instructed patient to contact office if experiencing any significant tolerability issues. No AIM noted  No med changes  Disc change Friday insurance and how to get new plan on the exchange.  Fu 6 mos bc more stable.  Meredith Staggersarey Cottle, MD, DFAPA  Please see After Visit Summary for patient specific instructions.  Future Appointments  Date Time Provider Department Center  04/04/2022  9:00 AM Cottle, Steva Readyarey G Jr., MD [facility] None    No orders of the defined types were placed in this encounter.     -------------------------------

## 2021-10-03 ENCOUNTER — Ambulatory Visit (INDEPENDENT_AMBULATORY_CARE_PROVIDER_SITE_OTHER): Payer: 59 | Admitting: Family Medicine

## 2021-10-03 DIAGNOSIS — S52572A Other intraarticular fracture of lower end of left radius, initial encounter for closed fracture: Secondary | ICD-10-CM | POA: Diagnosis not present

## 2021-10-03 DIAGNOSIS — S52502A Unspecified fracture of the lower end of left radius, initial encounter for closed fracture: Secondary | ICD-10-CM | POA: Insufficient documentation

## 2021-10-03 NOTE — Progress Notes (Signed)
   I, Philbert Riser, LAT, ATC acting as a scribe for Mason Graham, MD.  Subjective:    CC: L wrist pain  HPI: Pt is a 26 y/o male c/o male L wrist pain ongoing since Saturday morning. Pt was at a skate parking, and was grinding a rail, leaned back to fall and fell on his L arm outstretched. Pt is L-hand dominate. Pt was seen at [facility] yesterday. Pt locates pain to throughout his carpals. Left-hand-dominant.  Works as a Administrator.  Numbness/tingling: no Aggravates: certain motions, trying to pick things up Treatments tried: wrap, Tylenol, IBU  Dx imaging: 10/02/21 R hand XR (done @ [facility])  Pertinent review of Systems: No fevers or chills  Relevant historical information: Bipolar disorder.   Objective:    Vitals:   10/03/21 0859  BP: 110/70  Pulse: 70  SpO2: 98%   General: Well Developed, well nourished, and in no acute distress.   MSK: Left wrist: Slightly swollen.  Tender palpation dorsal radius.  Decreased wrist motion.  Strength not tested.  Lab and Radiology Results  X-ray images left wrist provided by patient on a CD obtained in urgent care yesterday personally and independently interpreted. Acute fracture distal radius without displacement or angulation.  EXAM:  CR left Wrist and Hand, 3  Views total.   CLINICAL HISTORY:   scozart (DICOM Hx) / left hand/wrist injury with edema, erythema and pain following falling while skateboardingACCIDENTAL INJURY (Pt comments) (DICOM Hx)   COMPARISON:  None provided.     FINDINGS:   BONES:  Impacted fracture distal radius with involvement of the radiocarpal joint   JOINTS:  No dislocation. The carpus demonstrates normal alignment.   SOFT TISSUES:  Soft tissue swelling   IMPRESSION:   1. Impacted fracture distal radius with involvement of the radiocarpal joint  2. Soft tissue swelling   Electronically signed by: Guadalupe Maple, M. D. on 10/02/2021 12:38:27   Impression and Recommendations:     Assessment and Plan: 26 y.o. male with left wrist fracture distal radius.  Patient was placed into a Exos cast.  Plan to recheck in 2 weeks.  Anticipate x-rays at that visit.Marland Kitchen Return to work when able.     Discussed warning signs or symptoms. Please see discharge instructions. Patient expresses understanding.  The above documentation has been reviewed and is accurate and complete Mason Owens, M.D.

## 2021-10-03 NOTE — Patient Instructions (Addendum)
Thank you for coming in today.   Return in about 2 weeks.   Typically use the cast for 6 weeks.   Ok to return to work when feeling ok enough to do so.

## 2021-10-04 ENCOUNTER — Ambulatory Visit: Payer: 59 | Admitting: Family Medicine

## 2021-10-17 ENCOUNTER — Ambulatory Visit (INDEPENDENT_AMBULATORY_CARE_PROVIDER_SITE_OTHER): Payer: 59

## 2021-10-17 ENCOUNTER — Ambulatory Visit: Payer: 59 | Admitting: Family Medicine

## 2021-10-17 VITALS — BP 112/72 | HR 63 | Ht 74.0 in | Wt 164.6 lb

## 2021-10-17 DIAGNOSIS — S52572A Other intraarticular fracture of lower end of left radius, initial encounter for closed fracture: Secondary | ICD-10-CM

## 2021-10-17 NOTE — Patient Instructions (Signed)
Thank you for coming in today.   Continue to wear the Exos cast.  Check back in 1 month

## 2021-10-17 NOTE — Progress Notes (Signed)
   I, Philbert Riser, LAT, ATC acting as a scribe for Clementeen Graham, MD.  Mason Owens is a 26 y.o. male who presents to [facility] today for f/u L distal radius fx. Pt was at a skate parking, and was grinding a rail, leaned back to fall and fell on his L arm outstretched. Pt is L-hand dominate. Pt was last seen by Dr. Denyse Amass on 10/03/21 and was placed in an Exos cast and advised to return to work when able. Today, pt reports pain/soreness along the ulnar aspect of the distal forearm. Pt has been compliant in wearing his Exos cast. Pt has increased pain w/ pronation/supination. Pt notes he has not yet returned to work.   Dx imaging: 10/02/21 R hand XR (done @ [facility])  Pertinent review of systems: No fevers or chills  Relevant historical information: Patient works Aeronautical engineer.  He has been out of work since the last visit.   Exam:  BP 112/72   Pulse 63   Ht 6\' 2"  (1.88 m)   Wt 164 lb 9.6 oz (74.7 kg)   SpO2 96%   BMI 21.13 kg/m  General: Well Developed, well nourished, and in no acute distress.   MSK: Left wrist out of cast normal appearing tender palpation distal radius slight decreased range of motion.  Pulses cap refill sensation and grip strength are intact.    Lab and Radiology Results  X-ray images left wrist obtained today personally and independently interpreted. Nondisplaced distal radius fracture is present.  No callus formation is present yet. Await formal radiology review    Assessment and Plan: 26 y.o. male with left wrist fracture.  Clinically improving.  Plan to continue immobilization with Exos cast for 1 month and recheck at that time.  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  He thinks he can get a temporary job at [employer] which would be much better.  I have  written a work note to see if there is anything he can do at his current job but he may need to have a temporary job switch.  Recheck in 1 month. Okay to take the cast off to clean at as well.   PDMP not reviewed this encounter. Orders Placed This Encounter  Procedures   DG Wrist Complete Left    Standing Status:   Future    Number of Occurrences:   1    Standing Expiration Date:   11/17/2021    Order Specific Question:   Reason for Exam (SYMPTOM  OR DIAGNOSIS REQUIRED)    Answer:   left wrist pain    Order Specific Question:   Preferred imaging location?    Answer:   11/19/2021   No orders of the defined types were placed in this encounter.    Discussed warning signs or symptoms. Please see discharge instructions. Patient expresses understanding.   The above documentation has been reviewed and is accurate and complete Kyra Searles, M.D.

## 2021-10-18 NOTE — Progress Notes (Signed)
Wrist x-ray shows that the fracture is healing.

## 2021-10-30 ENCOUNTER — Telehealth: Payer: Self-pay

## 2021-10-30 NOTE — Telephone Encounter (Signed)
Pt stopped by the office, c/o the strap going through the webspace of his hand constantly coming undone. Slight skin irritation was noted in the webspace. Pt loosened Exos cast, it was slid down slightly, more on the MCs, to lessen the stress on the velcro strap and 2-3 passes of 1" coban was applied through the webspace and around the hand/MC by AT. Pt was advised to cont immobilization and f/u at scheduled visit.  Pt also advised that the purpose of the cast is to immobilize the area and to limit wrist flex/ext as the cast intends. Pt verbalized appreciation and understanding.

## 2021-11-24 NOTE — Progress Notes (Unsigned)
   I, Peterson Lombard, LAT, ATC acting as a scribe for Lynne Leader, MD.  Mason Owens is a 26 y.o. male who presents to [place] [facility] today for f/u L distal radius fx. Pt works a Environmental education officer. Pt was at a skate parking, and was grinding a rail, leaned back to fall and fell on his L arm outstretched. Pt is L-hand dominate. Pt was last seen by Dr. Georgina Snell on 10/17/21 and was advised to cont immobilization w/ Exos cast. Pt stopped by the office on 10/30/21 c/o the strap on the Exos cast, so it was adjusted and re-enforced w/ 1" coban. Today, pt reports  Dx imaging: 10/17/21 L wrist XR 10/02/21 R hand XR (done @ [facility])  Pertinent review of systems: ***  Relevant historical information: ***   Exam:  There were no vitals taken for this visit. General: Well Developed, well nourished, and in no acute distress.   MSK: ***    Lab and Radiology Results No results found for this or any previous visit (from the past 72 hour(s)). No results found.     Assessment and Plan: 26 y.o. male with ***   PDMP not reviewed this encounter. No orders of the defined types were placed in this encounter.  No orders of the defined types were placed in this encounter.    Discussed warning signs or symptoms. Please see discharge instructions. Patient expresses understanding.   ***

## 2021-11-27 ENCOUNTER — Ambulatory Visit: Payer: 59 | Admitting: Family Medicine

## 2021-11-27 ENCOUNTER — Ambulatory Visit (INDEPENDENT_AMBULATORY_CARE_PROVIDER_SITE_OTHER): Payer: 59

## 2021-11-27 VITALS — BP 98/72 | HR 73 | Ht 74.0 in

## 2021-11-27 DIAGNOSIS — S52572A Other intraarticular fracture of lower end of left radius, initial encounter for closed fracture: Secondary | ICD-10-CM

## 2021-11-27 NOTE — Patient Instructions (Addendum)
Thank you for coming in today.   Continue the cast for another 2-4 weeks with activity.   Consider thumb loop lifting straps with heavy activity that you dont want to use a cast for.   Recheck as needed.   [facility] Elbow Rehab Ask your health care provider which exercises are safe for you. Do exercises exactly as told by your health care provider and adjust them as directed. It is normal to feel mild stretching, pulling, tightness, or discomfort as you do these exercises. Stop right away if you feel sudden pain or your pain gets worse. Do not begin these exercises until told by your health care provider. Stretching and range-of-motion exercises These exercises warm up your muscles and joints and improve the movement and flexibility of your elbow. Wrist flexion, assisted  Straighten your left / right elbow in front of you with your palm facing down toward the floor. If told by your health care provider, bend your left / right elbow to a 90-degree angle (right angle) at your side instead of holding it straight. With your other hand, gently push over the back of your left / right hand so your fingers point toward the floor (flexion). Stop when you feel a gentle stretch on the back of your forearm. Hold this position for __________ seconds. Repeat __________ times. Complete this exercise __________ times a day. Wrist extension, assisted  Straighten your left / right elbow in front of you with your palm facing up toward the ceiling. If told by your health care provider, bend your left / right elbow to a 90-degree angle (right angle) at your side instead of holding it straight. With your other hand, gently pull your left / right hand and fingers toward the floor (extension). Stop when you feel a gentle stretch on the palm side of your forearm. Hold this position for __________ seconds. Repeat __________ times. Complete this exercise __________ times a day. Assisted forearm rotation, supination Sit  or stand with your elbows at your side. Bend your left / right elbow to a 90-degree angle (right angle). Using your uninjured hand, turn your left / right palm up toward the ceiling (supination) until you feel a gentle stretch along the inside of your forearm. Hold this position for __________ seconds. Repeat __________ times. Complete this exercise __________ times a day. Assisted forearm rotation, pronation Sit or stand with your elbows at your side. Bend your left / right elbow to a 90-degree angle (right angle). Using your uninjured hand, turn your left / right palm down toward the floor (pronation) until you feel a gentle stretch along the outside of your forearm. Hold this position for __________ seconds. Repeat __________ times. Complete this exercise __________ times a day. Strengthening exercises These exercises build strength and endurance in your forearm and elbow. Endurance is the ability to use your muscles for a long time, even after they get tired. Radial deviation  Stand with a __________ weight or a hammer in your left / right hand. Or, sit while holding a rubber exercise band or tubing, with your left / right forearm supported on a table or countertop. Position your forearm so that the thumb is facing the ceiling, as if you are going to clap your hands. This is the neutral position. Raise your hand upward in front of you so your thumb moves toward the ceiling (radial deviation), or pull up on the rubber tubing. Keep your forearm and elbow still while you move your wrist only. Hold this  position for __________ seconds. Slowly return to the starting position. Repeat __________ times. Complete this exercise __________ times a day. Wrist extension, eccentric Sit with your left / right forearm palm-down and supported on a table or other surface. Let your left / right wrist extend over the edge of the surface. Hold a __________ weight or a piece of exercise band or tubing in your  left / right hand. If using a rubber exercise band or tubing, hold the other end of the tubing with your other hand. Use your uninjured hand to move your left / right hand up toward the ceiling. Take your uninjured hand away and slowly return to the starting position using only your left / right hand. Lowering your arm under tension is called eccentric extension. Repeat __________ times. Complete this exercise __________ times a day. Wrist extension Do not do this exercise if it causes pain at the outside of your elbow. Only do this exercise once instructed by your health care provider. Sit with your left / right forearm supported on a table or other surface and your palm turned down toward the floor. Let your left / right wrist extend over the edge of the surface. Hold a __________ weight or a piece of rubber exercise band or tubing. If you are using a rubber exercise band or tubing, hold the band or tubing in place with your other hand to provide resistance. Slowly bend your wrist so your hand moves up toward the ceiling (extension). Move only your wrist, keeping your forearm and elbow still. Hold this position for __________ seconds. Slowly return to the starting position. Repeat __________ times. Complete this exercise __________ times a day. Forearm rotation, supination To do this exercise, you will need a lightweight hammer or rubber mallet. Sit with your left / right forearm supported on a table or other surface. Bend your elbow to a 90-degree angle (right angle). Position your forearm so that your palm is facing down toward the floor, with your hand resting over the edge of the table. Hold a hammer in your left / right hand. To make this exercise easier, hold the hammer near the head of the hammer. To make this exercise harder, hold the hammer near the end of the handle. Without moving your wrist or elbow, slowly rotate your forearm so your palm faces up toward the ceiling  (supination). Hold this position for __________ seconds. Slowly return to the starting position. Repeat __________ times. Complete this exercise __________ times a day. Shoulder blade squeeze Sit in a stable chair or stand with good posture. If you are sitting down, do not let your back touch the back of the chair. Your arms should be at your sides with your elbows bent to a 90-degree angle (right angle). Position your forearms so that your thumbs are facing the ceiling (neutral position). Without lifting your shoulders up, squeeze your shoulder blades tightly together. Hold this position for __________ seconds. Slowly release and return to the starting position. Repeat __________ times. Complete this exercise __________ times a day. This information is not intended to replace advice given to you by your health care provider. Make sure you discuss any questions you have with your health care provider. Document Revised: 05/13/2019 Document Reviewed: 05/13/2019 Elsevier Patient Education  2023 [employer].

## 2021-11-28 NOTE — Progress Notes (Signed)
Wrist fracture is healing as we discussed in clinic.

## 2022-01-11 ENCOUNTER — Other Ambulatory Visit: Payer: Self-pay | Admitting: Psychiatry

## 2022-01-11 DIAGNOSIS — F3162 Bipolar disorder, current episode mixed, moderate: Secondary | ICD-10-CM

## 2022-01-13 ENCOUNTER — Encounter (HOSPITAL_COMMUNITY): Payer: Self-pay | Admitting: *Deleted

## 2022-01-13 ENCOUNTER — Ambulatory Visit (HOSPITAL_COMMUNITY)
Admission: EM | Admit: 2022-01-13 | Discharge: 2022-01-13 | Disposition: A | Discharge status: Home / Self Care | Payer: 59

## 2022-01-13 DIAGNOSIS — J029 Acute pharyngitis, unspecified: Secondary | ICD-10-CM | POA: Diagnosis not present

## 2022-01-13 DIAGNOSIS — J209 Acute bronchitis, unspecified: Secondary | ICD-10-CM

## 2022-01-13 MED ORDER — LIDOCAINE VISCOUS HCL 2 % MT SOLN: 5.0000 mL | Freq: Four times a day (QID) | OROMUCOSAL | 0 refills | Status: AC

## 2022-01-13 MED ORDER — DM-GUAIFENESIN ER 30-600 MG PO TB12: 1.0000 | ORAL_TABLET | Freq: Two times a day (BID) | ORAL | 1 refills | Status: AC

## 2022-01-13 NOTE — ED Triage Notes (Signed)
[facility] they did a strep test this morning it was neg. They told him that he might have a tonsil stone but they werent sure he should go to an urgent care and get a culture done to be sure.   He has had a sore throat x 2 weeks he has been taking no meds for this pain. He says there isn't much pain unless he lets his throat get dry and he can feel the thing in the back of his throat.

## 2022-01-13 NOTE — Discharge Instructions (Signed)
Advised to take the Mucinex DM every 12 hours to help control the chest congestion and cough. Advised to use the Magic mouthwash, gargle for 60 seconds and then spit, 4 times a day to help soothe the sore throat. Advised follow-up PCP or return to urgent care if symptoms fail to improve.

## 2022-01-13 NOTE — ED Provider Notes (Signed)
[facility]    CSN: [id number] Arrival date & time: 01/13/22  1224      History   Chief Complaint Chief Complaint  Patient presents with   Sore Throat    HPI KIMM UNGARO is a 26 y.o. male.   26 year old male presents with cough and chest congestion, sore throat.  Patient indicates that he smokes about a pack of cigarettes per day, he indicates that he rolls his own cigarettes and uses a high nicotine tobacco product.  Patient also indicates that he vapes in between smoking and that he also uses a high nicotine content solution for vaping.  Patient indicates that he has been smoking for the past 10 years.  Patient indicates for the past 2 weeks he has been having chest congestion, cough, with thick production which is usually clear.  He also has upper respiratory symptoms of mild rhinitis and postnasal drip with clear production.  He does indicate that over the past week he has had sore throat with painful swallowing which is intermittent and worse with his coughing.  He denies any fever or chills.  [facility] and they did a rapid strep test which was negative.  They did indicate that he had a tonsil stone on the right side which he indicates gives him some mild discomfort over the past several days.  Patient indicates he has not taken any OTC medications to control his symptoms.  He denies any wheezing or shortness of breath.   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 No pertinent surgical history.     Home  Medications    Prior to Admission medications   Medication Sig Start Date End Date Taking? Authorizing Provider  ARIPiprazole (ABILIFY) 20 MG tablet TAKE 1 TABLET BY MOUTH EVERY DAY 10/02/21  Yes Cottle, Steva Ready., MD  buPROPion (WELLBUTRIN XL) 300 MG 24 hr tablet Take 1 tablet (300 mg total) by mouth daily. 10/02/21  Yes Cottle, Steva Ready., MD  dextromethorphan-guaiFENesin [facility] DM) 30-600 MG 12hr tablet Take 1 tablet by mouth 2 (two) times daily. 01/13/22  Yes Ellsworth Lennox, PA-C  divalproex (DEPAKOTE) 500 MG DR tablet TAKE 4 TABLETS BY MOUTH EVERY DAY 01/11/22  Yes Cottle, Steva Ready., MD  magic mouthwash (lidocaine, diphenhydrAMINE, alum & mag hydroxide) suspension Swish and spit 5 mLs 4 (four) times daily. 01/13/22  Yes Ellsworth Lennox, PA-C  naproxen sodium (ALEVE) 220 MG tablet Take 220 mg by mouth.   Yes [provider]  dexmethylphenidate (FOCALIN XR) 10 MG 24 hr capsule Take 1 capsule (10 mg total) by mouth daily. 11/27/21   Cottle, Steva Ready., MD  dexmethylphenidate (FOCALIN XR) 10 MG 24 hr capsule Take 1 capsule (10 mg total) by mouth every morning. 10/30/21   Cottle, Steva Ready., MD  dexmethylphenidate (FOCALIN XR) 10 MG 24 hr capsule Take 1 capsule (10 mg total) by mouth daily. 10/02/21   Cottle, Steva Ready., MD    Family History Family History  Problem Relation Age  of Onset   Healthy Mother    ADD / ADHD Father    Depression Father    Anxiety disorder Brother    Depression Maternal Grandmother    Anxiety disorder Maternal Grandmother     Social History Social History   Tobacco Use   Smoking status: Every Day    Packs/day: 0.50    Types: Cigarettes   Smokeless tobacco: Never  Substance Use Topics   Alcohol use: Yes    Alcohol/week: 4.0 - 5.0 standard drinks of alcohol    Types: 4 - 5 Cans of beer per week    Comment: all types of alcohol drinks   Drug use: Yes    Types: Marijuana    Comment: marijuana daily     Allergies   Patient has no known  allergies.   Review of Systems Review of Systems  HENT:  Positive for sore throat.   Respiratory:  Positive for cough.      Physical Exam Triage Vital Signs ED Triage Vitals  Enc Vitals Group     BP 01/13/22 1303 112/60     Pulse Rate 01/13/22 1303 76     Resp 01/13/22 1303 18     Temp 01/13/22 1303 98.1 F (36.7 C)     Temp Source 01/13/22 1303 Oral     SpO2 01/13/22 1303 100 %     Weight --      Height --      Head Circumference --      Peak Flow --      Pain Score 01/13/22 1301 2     Pain Loc --      Pain Edu? --      Excl. in [place]? --    No data found.  Updated Vital Signs BP 112/60 (BP Location: Right Arm)   Pulse 76   Temp 98.1 F (36.7 C) (Oral)   Resp 18   SpO2 100%   Visual Acuity Right Eye Distance:   Left Eye Distance:   Bilateral Distance:    Right Eye Near:   Left Eye Near:    Bilateral Near:     Physical Exam Constitutional:      Appearance: He is well-developed.  HENT:     Right Ear: Tympanic membrane and ear canal normal.     Left Ear: Tympanic membrane and ear canal normal.     Mouth/Throat:     Mouth: Mucous membranes are moist.     Pharynx: Oropharynx is clear. Posterior oropharyngeal erythema (mild with tonsil stone on the right side which is small) present.  Cardiovascular:     Rate and Rhythm: Normal rate and regular rhythm.     Heart sounds: Normal heart sounds.  Pulmonary:     Effort: Pulmonary effort is normal.     Breath sounds: Normal breath sounds and air entry. No wheezing, rhonchi or rales.  Lymphadenopathy:     Cervical: Cervical adenopathy (mild anterior adenopathy present bilat) present.  Neurological:     Mental Status: He is alert.      UC Treatments / Results  Labs (all labs ordered are listed, but only abnormal results are displayed) Labs Reviewed - No data to display  EKG   Radiology No results found.  Procedures Procedures (including critical care time)  Medications Ordered in UC Medications -  No data to display  Initial Impression / Assessment and Plan / UC Course  I have reviewed the triage vital signs and the nursing notes.  Pertinent  labs & imaging results that were available during my care of the patient were reviewed by me and considered in my medical decision making (see chart for details).    Plan: 1.  The pharyngitis will be treated with the following: A.  Magic mouthwash, gargle 60 seconds then spit, 4 times a day to help soothe the sore throat. 2.  The acute bronchitis will be treated with the following: A.  Mucinex DM every 12 hours to help control cough and chest congestion. B.  Advised patient to reduce the amount of smoking on a daily basis. 3.  Advised follow-up PCP or return to urgent care if symptoms fail to improve. Final Clinical Impressions(s) / UC Diagnoses   Final diagnoses:  Pharyngitis, unspecified etiology  Acute bronchitis, unspecified organism     Discharge Instructions      Advised to take the Mucinex DM every 12 hours to help control the chest congestion and cough. Advised to use the Magic mouthwash, gargle for 60 seconds and then spit, 4 times a day to help soothe the sore throat. Advised follow-up PCP or return to urgent care if symptoms fail to improve.    ED Prescriptions     Medication Sig Dispense Auth. Provider   magic mouthwash (lidocaine, diphenhydrAMINE, alum & mag hydroxide) suspension Swish and spit 5 mLs 4 (four) times daily. 180 mL Nyoka Lint, PA-C   dextromethorphan-guaiFENesin [facility] DM) 30-600 MG 12hr tablet Take 1 tablet by mouth 2 (two) times daily. 20 tablet Nyoka Lint, PA-C      PDMP not reviewed this encounter.   Nyoka Lint, PA-C 01/13/22 1344

## 2022-03-01 ENCOUNTER — Telehealth: Payer: Self-pay | Admitting: Psychiatry

## 2022-03-01 NOTE — Telephone Encounter (Signed)
Patient has a RF at the pharmacy. LVM with this information.

## 2022-03-01 NOTE — Telephone Encounter (Signed)
Pt called and is requesting Focalin XR 10mg . Apt 1/31  [id number] - [place], [place] - [address]

## 2022-04-04 ENCOUNTER — Telehealth: Payer: 59 | Admitting: Psychiatry

## 2022-04-15 ENCOUNTER — Other Ambulatory Visit: Payer: Self-pay | Admitting: Psychiatry

## 2022-04-15 DIAGNOSIS — F3162 Bipolar disorder, current episode mixed, moderate: Secondary | ICD-10-CM

## 2022-04-24 ENCOUNTER — Telehealth: Payer: Self-pay | Admitting: Psychiatry

## 2022-04-24 NOTE — Telephone Encounter (Signed)
Pt called asking for a refill on his focalin xr 10 mg. Pharmacy is [facility] [address]. Pt is out

## 2022-04-25 ENCOUNTER — Other Ambulatory Visit: Payer: Self-pay

## 2022-04-25 DIAGNOSIS — F900 Attention-deficit hyperactivity disorder, predominantly inattentive type: Secondary | ICD-10-CM

## 2022-04-25 MED ORDER — DEXMETHYLPHENIDATE HCL ER 10 MG PO CP24: 10.0000 mg | ORAL_CAPSULE | Freq: Every day | ORAL | 0 refills | Status: DC

## 2022-04-25 NOTE — Telephone Encounter (Signed)
Pended.

## 2022-05-29 ENCOUNTER — Encounter: Payer: Self-pay | Admitting: Psychiatry

## 2022-05-29 ENCOUNTER — Ambulatory Visit (INDEPENDENT_AMBULATORY_CARE_PROVIDER_SITE_OTHER): Payer: Commercial Managed Care - HMO | Admitting: Psychiatry

## 2022-05-29 DIAGNOSIS — F3162 Bipolar disorder, current episode mixed, moderate: Secondary | ICD-10-CM | POA: Diagnosis not present

## 2022-05-29 DIAGNOSIS — F411 Generalized anxiety disorder: Secondary | ICD-10-CM | POA: Diagnosis not present

## 2022-05-29 DIAGNOSIS — F900 Attention-deficit hyperactivity disorder, predominantly inattentive type: Secondary | ICD-10-CM | POA: Diagnosis not present

## 2022-05-29 MED ORDER — DIVALPROEX SODIUM 500 MG PO DR TAB: 2000.0000 mg | DELAYED_RELEASE_TABLET | Freq: Every day | ORAL | 1 refills | Status: AC

## 2022-05-29 MED ORDER — BUPROPION HCL ER (XL) 300 MG PO TB24: 300.0000 mg | ORAL_TABLET | Freq: Every day | ORAL | 1 refills | Status: AC

## 2022-05-29 MED ORDER — DEXMETHYLPHENIDATE HCL ER 15 MG PO CP24: 15.0000 mg | ORAL_CAPSULE | Freq: Every day | ORAL | 0 refills | Status: DC

## 2022-05-29 MED ORDER — DEXMETHYLPHENIDATE HCL ER 15 MG PO CP24: 15.0000 mg | ORAL_CAPSULE | Freq: Every day | ORAL | 0 refills | Status: AC

## 2022-05-29 MED ORDER — ARIPIPRAZOLE 20 MG PO TABS: ORAL_TABLET | ORAL | 1 refills | Status: AC

## 2022-05-29 MED ORDER — DEXMETHYLPHENIDATE HCL ER 15 MG PO CP24: 15.0000 mg | ORAL_CAPSULE | Freq: Every morning | ORAL | 0 refills | Status: AC

## 2022-05-29 NOTE — Progress Notes (Signed)
Mason Owens [id number] 1995/11/09 27 y.o.    Subjective:   Patient ID:  Mason Owens is a 27 y.o. (DOB 1996/01/14) male.  Chief Complaint:  Chief Complaint  Patient presents with   Follow-up    Depression        Associated symptoms include decreased concentration.  Associated symptoms include no myalgias and no suicidal ideas.  Past medical history includes anxiety.   Anxiety Symptoms include decreased concentration. Patient reports no chest pain, confusion, nervous/anxious behavior, palpitations or suicidal ideas.     Mason Owens presents to the office today for follow-up of above.  seen December 25, 2018.  Depakote was increased from 1500 to 2000 mg daily because of mixed symptoms.  Work in appt to evaluate outcome of the change.  GF noticed no change.  He feels a little more control some days but not daily.  A lot of days very labile.  This week in a funk and procrastinating.  Yesterday easily overwhelmed by simple things.  Feels he needs to  Continue emotional growth in therapy also.  seen November 2020.  Valproic acid CBC checked and noted.  No med changes are made.   seen 06/02/2019 with the following noted: Honestly still a mess and not able to pick himself up.  Crappy drug and no motivation and just getting by.  Lately worse anxiety.  In his head too much and reacting to stress.   Isolated.  Coworker not a good influence.  Clouded thoughts with anxiety.  Moved to [employer].  Life events happened.  Moved in with Mason Owens of 2y.  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   Started hard physically intense job and got to the end of himself.  Still feels out of control mentally.  Quit the job 2 weeks ago.  Not doing healthy things right now.  Ruminating thoughts with a lot of time. Anxiety throwing him into mood swings.  Jumping from one thing to another.  Easily overwhelmed emotionally. Smokes weed a bit too much as a coping mechanism.  Nothing to do and  gets stoned and then more negative thinking.   Because of persistent symptoms the plan was to wean Latuda and start Vraylar 1.5 mg daily for bipolar depression.  However insurance would not cover it so we switched to Abilify 10 mg daily.  There was also concern that the patient will eventually lose his insurance and he needed to be on a generic  07/30/19 with the following noted: No change with Abilify much.  Life in general sucks.  He thinks that may be a drag on his mood as well.  Things are not working out in [place] as he had hoped. Started a new job and still needs his Focalin but is out.  He has been compliant with medications.  No side effects with Abilify.  Sleeping all right except complaining of some back pain and asked if there is anything I could do to help with that. Plan: We will use gabapentin off label for anxiety and bipolar disorder irritability which could also help with his back pain.  Discussed specifically that I am not making any diagnosis of the type of back pain that he has given that I cannot make a physical exam at this time but he believes it is related to overworking.  The back pain is interfering with his sleep.  Gabapentin could potentially serve several purposes at this point.  Emphasized that his back pain gets  worse or if he starts getting sciatica symptoms that he needs to see a physician and have a physical exam.  He agrees discussed side effects about gabapentin. Start gabapentin 300 mg 3 times daily.  05/16/2020 appointment with the following noted: Seen with mother Mason Owens A lot of ups and downs. Rel in [place] ended and back home since September. Was self medicating with cannabis keeping me in a funk for awhile and has cut back.  A lot of emotional ups and downs with GF passed.  Last several months are harder.  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  Would like to get reevaluated for mental health things.  Pretty anxious and some OCD tendencies.  I feel  crazy but I don't think so.  Wants a label.  Seeing a therapist just started, Mason Rowan, LCSW, [place] including addictions. Does have health insurance right now.    Sleep 8 hours and good usually.   Hx gabapentin 300 TID with some benefit but had withdrawal pain if missed it. Mason Owens noticed some periods of depression and too down to be motivated to do something differently.  Was using a lot of THC in [place].  A lot of anxiety and beating himself up.  Some irritability with family. Often remorseful.   Plan: No side effects or particular benefit from Abilify 10 mg.  Therefore continue 15 mg which is a more typical mood stabilizing dose especially in a young healthy person.  He agrees to this plan looking for improvement in irritability and depression and anxiety. Continue Depakote 2000 mg daily.  His symptoms worsened whenever he tried to wean this medicine  07/14/2020 appointment with the following noted: Not noticed too much.   Still  Going through a lot of things with on again and off again relationship with GF.  Weed use varies.  No real changes with increase Abilify to 15 mg  Good or bad.  Someone told him he's "really violent" in his sleep.   Continues Wellbutrin xl 300 AM and Depakote DR 1500 HS. Vraylar coverage is not good with insurance.  04/04/2021 appointment with the following noted: Has missed several appointments. Inconsistent with meds.  Forgets if splits dose.  Needs to take it at night. Recognizes ambivalence about meds and sometimes just doesn't take and then he feels worse.  Not sure if it is mania that causes it. Meds work well if reasonably consistent.   Got a raise and some good things happened. Hard breakup in November after living together an smoked a lot of weed.   Lives alone.  Does things with couple of friends and doesn't reach out like he should. Uses Focalin prn like when has double shifts. Sleep is better but always thrashed some in his sleep.  No NM and feels  rested. Didn't like last therapist who only wanted to focus on his pot use. No I need it but not sure he's willing.  10/02/21 appt noted: Pretty well.  Mental health not too bad.  Mood swings not serious.  More managed but still has some of them.  Not a lot of stressors right now. Satisfied with current meds.   No SE.   Health insurance change.    05/29/22 appt noted: Consistent with meds.  Doesn't want to change meds.  Pretty stable.  Doesn't notice a lot of Focalin.  No SE Patient denies difficulty with sleep initiation or maintenance. Denies appetite disturbance.  Patient reports that energy and motivation have been good.  Concentration impaired  when emotionally upset.  Periods of agitation both internally and behaviorally.  Patient denies any suicidal ideation. Works 630[employer].  Can impact sleep  Past Psychiatric Medication Trials: Depakote ER, Tegretol,  lamotrigine, lithium 1200 no response, Latuda 120, perphenazine 12, Abilify 15  Wellbutrin, sertraline no response, Pristiq 50,  Focalin, Ritalin , Concerta crash, Nuvigil, Intuniv side effects Seen in this office since 2008  Review of Systems:  Review of Systems  Cardiovascular:  Negative for chest pain and palpitations.  Musculoskeletal:  Negative for back pain and myalgias.  Psychiatric/Behavioral:  Positive for decreased concentration. Negative for agitation, behavioral problems, confusion, dysphoric mood, hallucinations, self-injury, sleep disturbance and suicidal ideas. The patient is not nervous/anxious and is not hyperactive.     Medications: I have reviewed the patient's current medications.  Current Outpatient Medications  Medication Sig Dispense Refill   dextromethorphan-guaiFENesin (MUCINEX DM) 30-600 MG 12hr tablet Take 1 tablet by mouth 2 (two) times daily. 20 tablet 1   magic mouthwash (lidocaine, diphenhydrAMINE, alum & mag hydroxide) suspension Swish and spit 5 mLs 4 (four) times daily. 180 mL 0   naproxen sodium  (ALEVE) 220 MG tablet Take 220 mg by mouth.     ARIPiprazole (ABILIFY) 20 MG tablet TAKE 1 TABLET BY MOUTH EVERY DAY 90 tablet 1   buPROPion (WELLBUTRIN XL) 300 MG 24 hr tablet Take 1 tablet (300 mg total) by mouth daily. 90 tablet 1   dexmethylphenidate (FOCALIN XR) 15 MG 24 hr capsule Take 1 capsule (15 mg total) by mouth daily. 30 capsule 0   [START ON 06/26/2022] dexmethylphenidate (FOCALIN XR) 15 MG 24 hr capsule Take 1 capsule (15 mg total) by mouth daily. 30 capsule 0   [START ON 07/24/2022] dexmethylphenidate (FOCALIN XR) 15 MG 24 hr capsule Take 1 capsule (15 mg total) by mouth every morning. 30 capsule 0   divalproex (DEPAKOTE) 500 MG DR tablet Take 4 tablets (2,000 mg total) by mouth daily. 360 tablet 1   No current facility-administered medications for this visit.    Medication Side Effects: None  Allergies: No Known Allergies  Past Medical History:  Diagnosis Date   Bipolar disorder ([place])     Family History  Problem Relation Age of Onset   Healthy Mother    ADD / ADHD Father    Depression Father    Anxiety disorder Brother    Depression Maternal Grandmother    Anxiety disorder Maternal Grandmother     Social History   Socioeconomic History   Marital status: Single    Spouse name: Not on file   Number of children: Not on file   Years of education: Not on file   Highest education level: Not on file  Occupational History   Not on file  Tobacco Use   Smoking status: Every Day    Packs/day: .5    Types: Cigarettes   Smokeless tobacco: Never  Substance and Sexual Activity   Alcohol use: Yes    Alcohol/week: 4.0 - 5.0 standard drinks of alcohol    Types: 4 - 5 Cans of beer per week    Comment: all types of alcohol drinks   Drug use: Yes    Types: Marijuana    Comment: marijuana daily   Sexual activity: Yes    Birth control/protection: Condom  Other Topics Concern   Not on file  Social History Narrative   Not on file   Social Determinants of Health    Financial Resource Strain: Not on file  Food Insecurity:  Not on file  Transportation Needs: Not on file  Physical Activity: Not on file  Stress: Not on file  Social Connections: Not on file  Intimate Partner Violence: Not on file    Past Medical History, Surgical history, Social history, and Family history were reviewed and updated as appropriate.   Please see review of systems for further details on the patient's review from today.   Objective:   Physical Exam:  There were no vitals taken for this visit.  Physical Exam Constitutional:      General: He is not in acute distress. Musculoskeletal:        General: No deformity.  Neurological:     Mental Status: He is alert and oriented to person, place, and time.     Cranial Nerves: No dysarthria.     Coordination: Coordination normal.  Psychiatric:        Attention and Perception: Attention and perception normal. He does not perceive auditory or visual hallucinations.        Mood and Affect: Mood is not anxious or depressed. Affect is not labile, blunt or angry.        Speech: Speech normal.        Behavior: Behavior normal. Behavior is cooperative.        Thought Content: Thought content normal. Thought content is not paranoid or delusional. Thought content does not include homicidal or suicidal ideation. Thought content does not include suicidal plan.        Cognition and Memory: Cognition and memory normal.        Judgment: Judgment normal.     Comments: Fair insight and judgment. Better mood stability.     Lab Review:     Component Value Date/Time   NA 141 07/06/2014 1458   K 4.2 07/06/2014 1458   CL 104 07/06/2014 1458   CO2 27 07/06/2014 1458   GLUCOSE 81 07/06/2014 1458   BUN 12 07/06/2014 1458   CREATININE 0.70 07/06/2014 1458   CALCIUM 9.4 07/06/2014 1458   PROT 7.2 07/06/2014 1458   ALBUMIN 4.7 07/06/2014 1458   AST 18 07/06/2014 1458   ALT 8 07/06/2014 1458   ALKPHOS 44 07/06/2014 1458   BILITOT 0.5  07/06/2014 1458       Component Value Date/Time   WBC 4.7 01/23/2019 0000   RBC 4.66 01/23/2019 0000   HGB 14.4 01/23/2019 0000   HCT 43.3 01/23/2019 0000   PLT 167 01/23/2019 0000   MCV 92.9 01/23/2019 0000   MCH 30.9 01/23/2019 0000   MCHC 33.3 01/23/2019 0000   RDW 12.5 01/23/2019 0000    No results found for: "POCLITH", "LITHIUM"   Lab Results  Component Value Date   VALPROATE 119.8 (H) 01/23/2019     .res Assessment: Plan:    Mason Owens was seen today for follow-up.  Diagnoses and all orders for this visit:  Attention deficit hyperactivity disorder (ADHD), predominantly inattentive type -     dexmethylphenidate (FOCALIN XR) 15 MG 24 hr capsule; Take 1 capsule (15 mg total) by mouth daily. -     dexmethylphenidate (FOCALIN XR) 15 MG 24 hr capsule; Take 1 capsule (15 mg total) by mouth daily. -     dexmethylphenidate (FOCALIN XR) 15 MG 24 hr capsule; Take 1 capsule (15 mg total) by mouth every morning. -     buPROPion (WELLBUTRIN XL) 300 MG 24 hr tablet; Take 1 tablet (300 mg total) by mouth daily.  Bipolar 1 disorder, mixed, moderate (HCC) -  ARIPiprazole (ABILIFY) 20 MG tablet; TAKE 1 TABLET BY MOUTH EVERY DAY -     buPROPion (WELLBUTRIN XL) 300 MG 24 hr tablet; Take 1 tablet (300 mg total) by mouth daily. -     divalproex (DEPAKOTE) 500 MG DR tablet; Take 4 tablets (2,000 mg total) by mouth daily.     Greater than 50% of 45 minutes face to face time with patient was spent on counseling and coordination of care. We discussed Patient has had a relapse of bipolar disorder with mixed symptoms.    Missed several appointments from May 2022 until January 2023. Says he just forgot.  There is been significant change in symptoms externally with inccrease Ability to 20 mg daily. No side effects or particular benefit from Abilify 20 mg .   Mood better with increase..   Discussed potential metabolic side effects associated with atypical antipsychotics, as well as potential  risk for movement side effects. Advised pt to contact office if movement side effects occur.   He is aware to avoid THC in daytime with meds.  He admits he is smoking too much in the daytime and that that is probably not helpful.  He is trying to cut it back.  Continue Depakote to 2000 mg daily  Discussed side effects in detail.   Explained the fairly wide dosing range options.  Checked valproic acid level and CBC.  CBC unremarkable in November 2020.  Specifically platelet count 167 and normal WBC and hematocrit.  Valproic acid level slightly elevated at 120 which is considered acceptable in psychiatry.  Supportive therapy dealing with sleep  Continue therapy. CBT encourage it.  increase Focalin XR 15 mg as needed ADD. Discussed potential benefits, risks, and side effects of stimulants with patient to include increased heart rate, palpitations, insomnia, increased anxiety, increased irritability, or decreased appetite.  Instructed patient to contact office if experiencing any significant tolerability issues. No AIM noted  No med changes  Disc change Friday insurance and how to get new plan on the exchange.  Fu 6 mos bc more stable.  Lynder Parents, MD, DFAPA  Please see After Visit Summary for patient specific instructions.  No future appointments.   No orders of the defined types were placed in this encounter.     -------------------------------

## 2022-06-04 ENCOUNTER — Other Ambulatory Visit: Payer: Self-pay | Admitting: Psychiatry

## 2022-06-04 ENCOUNTER — Telehealth: Payer: Self-pay | Admitting: Psychiatry

## 2022-06-04 DIAGNOSIS — F900 Attention-deficit hyperactivity disorder, predominantly inattentive type: Secondary | ICD-10-CM

## 2022-06-04 MED ORDER — DEXMETHYLPHENIDATE HCL ER 15 MG PO CP24: 15.0000 mg | ORAL_CAPSULE | Freq: Every day | ORAL | 0 refills | Status: AC

## 2022-06-04 NOTE — Telephone Encounter (Signed)
Patient called in stating that he has found a pharmacy that has Focalin in stock and needs it sent to [facility] Ph: [phone] Appt 9/26

## 2022-11-29 ENCOUNTER — Ambulatory Visit: Payer: Commercial Managed Care - HMO | Admitting: Psychiatry

## 2023-02-23 ENCOUNTER — Ambulatory Visit
Admission: EM | Admit: 2023-02-23 | Discharge: 2023-02-23 | Disposition: A | Discharge status: Home / Self Care | Payer: Commercial Managed Care - HMO | Attending: Internal Medicine | Admitting: Internal Medicine

## 2023-02-23 DIAGNOSIS — F17200 Nicotine dependence, unspecified, uncomplicated: Secondary | ICD-10-CM

## 2023-02-23 DIAGNOSIS — J40 Bronchitis, not specified as acute or chronic: Secondary | ICD-10-CM

## 2023-02-23 DIAGNOSIS — J329 Chronic sinusitis, unspecified: Secondary | ICD-10-CM | POA: Diagnosis not present

## 2023-02-23 LAB — POCT RAPID STREP A (OFFICE): Rapid Strep A Screen: NEGATIVE

## 2023-02-23 MED ORDER — PREDNISONE 20 MG PO TABS: ORAL_TABLET | ORAL | 0 refills | Status: AC

## 2023-02-23 MED ORDER — CETIRIZINE HCL 10 MG PO TABS: 10.0000 mg | ORAL_TABLET | Freq: Every day | ORAL | 0 refills | Status: AC

## 2023-02-23 MED ORDER — PROMETHAZINE-DM 6.25-15 MG/5ML PO SYRP: 5.0000 mL | ORAL_SOLUTION | Freq: Three times a day (TID) | ORAL | 0 refills | Status: AC | PRN

## 2023-02-23 MED ORDER — AMOXICILLIN-POT CLAVULANATE 875-125 MG PO TABS: 1.0000 | ORAL_TABLET | Freq: Two times a day (BID) | ORAL | 0 refills | Status: AC

## 2023-02-23 NOTE — Discharge Instructions (Addendum)
Start Augmentin and prednisone for the sinobronchitis infection. Use cough syrup as needed. Also do the albuterol inhaler for your wheezing.

## 2023-02-23 NOTE — ED Triage Notes (Signed)
Pt c/o sore throat, swelling to left neck lymph node, chills, prod cough,fatigue-sx started 11/29-NAD-steady gait

## 2023-02-23 NOTE — ED Provider Notes (Signed)
[facility]  Note:  This document was prepared using Conservation officer, historic buildings and may include unintentional dictation errors.  MRN: [id number] DOB: [birth year]  Subjective:   Mason Owens is a 27 y.o. male presenting for 3-week history of acute onset persistent throat pain, lymph node pain, chills, malaise and fatigue, chest congestion, chest pain with a productive cough.  Patient is a smoker.  Smokes cigarettes multiple times daily, has 1/2ppd, smokes marijuana daily.  Has previously used steroids well.  Has an albuterol inhaler.  No current facility-administered medications for this encounter.  Current Outpatient Medications:    ARIPiprazole (ABILIFY) 20 MG tablet, TAKE 1 TABLET BY MOUTH EVERY DAY, Disp: 90 tablet, Rfl: 1   buPROPion (WELLBUTRIN XL) 300 MG 24 hr tablet, Take 1 tablet (300 mg total) by mouth daily., Disp: 90 tablet, Rfl: 1   dexmethylphenidate (FOCALIN XR) 15 MG 24 hr capsule, Take 1 capsule (15 mg total) by mouth daily., Disp: 30 capsule, Rfl: 0   dexmethylphenidate (FOCALIN XR) 15 MG 24 hr capsule, Take 1 capsule (15 mg total) by mouth every morning., Disp: 30 capsule, Rfl: 0   dexmethylphenidate (FOCALIN XR) 15 MG 24 hr capsule, Take 1 capsule (15 mg total) by mouth daily., Disp: 30 capsule, Rfl: 0   dextromethorphan-guaiFENesin (MUCINEX DM) 30-600 MG 12hr tablet, Take 1 tablet by mouth 2 (two) times daily., Disp: 20 tablet, Rfl: 1   divalproex (DEPAKOTE) 500 MG DR tablet, Take 4 tablets (2,000 mg total) by mouth daily., Disp: 360 tablet, Rfl: 1   magic mouthwash (lidocaine, diphenhydrAMINE, alum & mag hydroxide) suspension, Swish and spit 5 mLs 4 (four) times daily., Disp: 180 mL, Rfl: 0   naproxen sodium (ALEVE) 220 MG tablet, Take 220 mg by mouth., Disp: , Rfl:    No Known Allergies  Past Medical History:  Diagnosis Date   Bipolar disorder (HCC)      History reviewed. No pertinent surgical history.  Family History  Problem  Relation Age of Onset   Healthy Mother    ADD / ADHD Father    Depression Father    Anxiety disorder Brother    Depression Maternal Grandmother    Anxiety disorder Maternal Grandmother     Social History   Tobacco Use   Smoking status: Every Day    Current packs/day: 0.50    Types: Cigarettes   Smokeless tobacco: Never  Vaping Use   Vaping status: Every Day  Substance Use Topics   Alcohol use: Yes    Comment: occ   Drug use: Yes    Types: Marijuana    Comment: marijuana daily    ROS   Objective:   Vitals: BP 117/78 (BP Location: Right Arm)   Pulse 83   Temp 98.6 F (37 C) (Oral)   Resp 20   SpO2 98%   Physical Exam Constitutional:      General: He is not in acute distress.    Appearance: Normal appearance. He is well-developed and normal weight. He is not ill-appearing, toxic-appearing or diaphoretic.  HENT:     Head: Normocephalic and atraumatic.     Right Ear: Tympanic membrane, ear canal and external ear normal. No drainage, swelling or tenderness. No middle ear effusion. There is no impacted cerumen. Tympanic membrane is not erythematous or bulging.     Left Ear: Tympanic membrane, ear canal and external ear normal. No drainage, swelling or tenderness.  No middle ear effusion. There is no impacted cerumen. Tympanic  membrane is not erythematous or bulging.     Nose: Congestion present. No rhinorrhea.     Mouth/Throat:     Mouth: Mucous membranes are moist.     Pharynx: Posterior oropharyngeal erythema present. No pharyngeal swelling, oropharyngeal exudate or uvula swelling.     Tonsils: No tonsillar exudate or tonsillar abscesses. 0 on the right. 0 on the left.  Eyes:     General: No scleral icterus.       Right eye: No discharge.        Left eye: No discharge.     Extraocular Movements: Extraocular movements intact.     Conjunctiva/sclera: Conjunctivae normal.  Cardiovascular:     Rate and Rhythm: Normal rate and regular rhythm.     Heart sounds: Normal  heart sounds. No murmur heard.    No friction rub. No gallop.  Pulmonary:     Effort: Pulmonary effort is normal. No respiratory distress.     Breath sounds: No stridor. Rhonchi present. No wheezing or rales.  Musculoskeletal:     Cervical back: Normal range of motion and neck supple. No rigidity. No muscular tenderness.  Neurological:     General: No focal deficit present.     Mental Status: He is alert and oriented to person, place, and time.  Psychiatric:        Mood and Affect: Mood normal.        Behavior: Behavior normal.        Thought Content: Thought content normal.        Judgment: Judgment normal.     Results for orders placed or performed during the hospital encounter of 02/23/23 (from the past 24 hours)  POCT rapid strep A     Status: None   Collection Time: 02/23/23  1:11 PM  Result Value Ref Range   Rapid Strep A Screen Negative Negative    Assessment and Plan :   PDMP not reviewed this encounter.  1. Sinobronchitis   2. Smoker    Suspect sinobronchitis and will address this with Augmentin and a prednisone course, general supportive care.  Will defer imaging for now and strep culture as well.  Restart albuterol inhaler.  Avoid smoking as much as possible.  Counseled patient on potential for adverse effects with medications prescribed/recommended today, ER and return-to-clinic precautions discussed, patient verbalized understanding.    Wallis Bamberg, [place] 02/23/23 1339

## 2023-04-18 IMAGING — DX DG RIBS W/ CHEST 3+V*L*
4 series · 4 of 4 positions shown · non-contrast
Comparison: 11/26/2020

CLINICAL DATA: Injury with left lower rib pain

EXAM:
LEFT RIBS AND CHEST - 3+ VIEW

[chest pa]
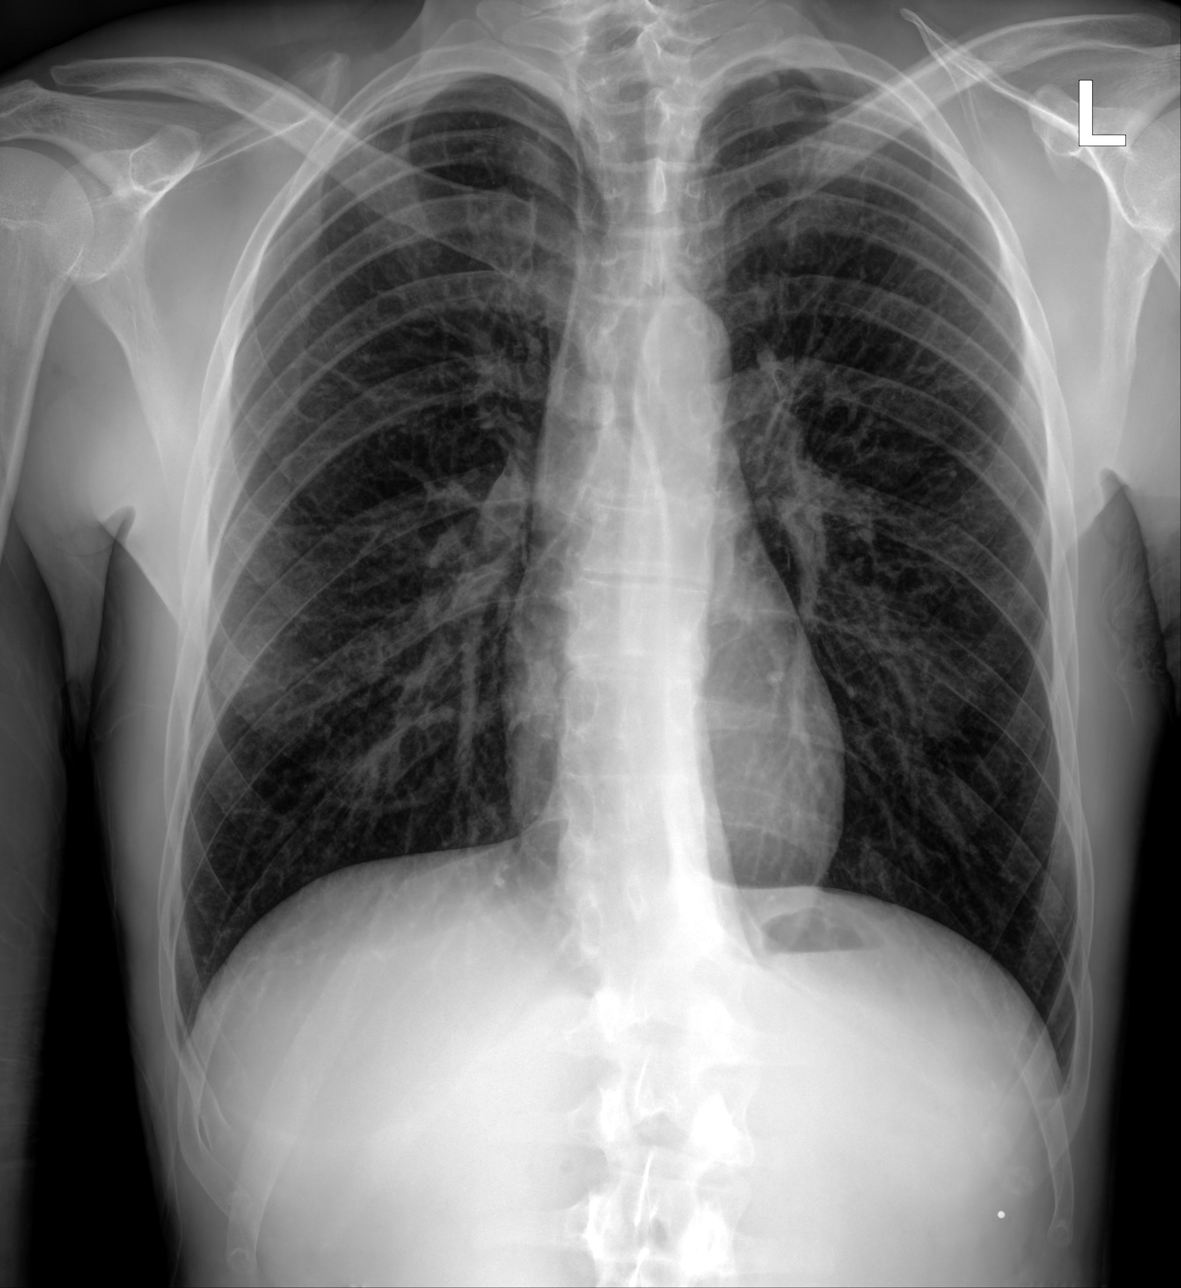

[hemithorax (ribs) pa (1 of 3)]
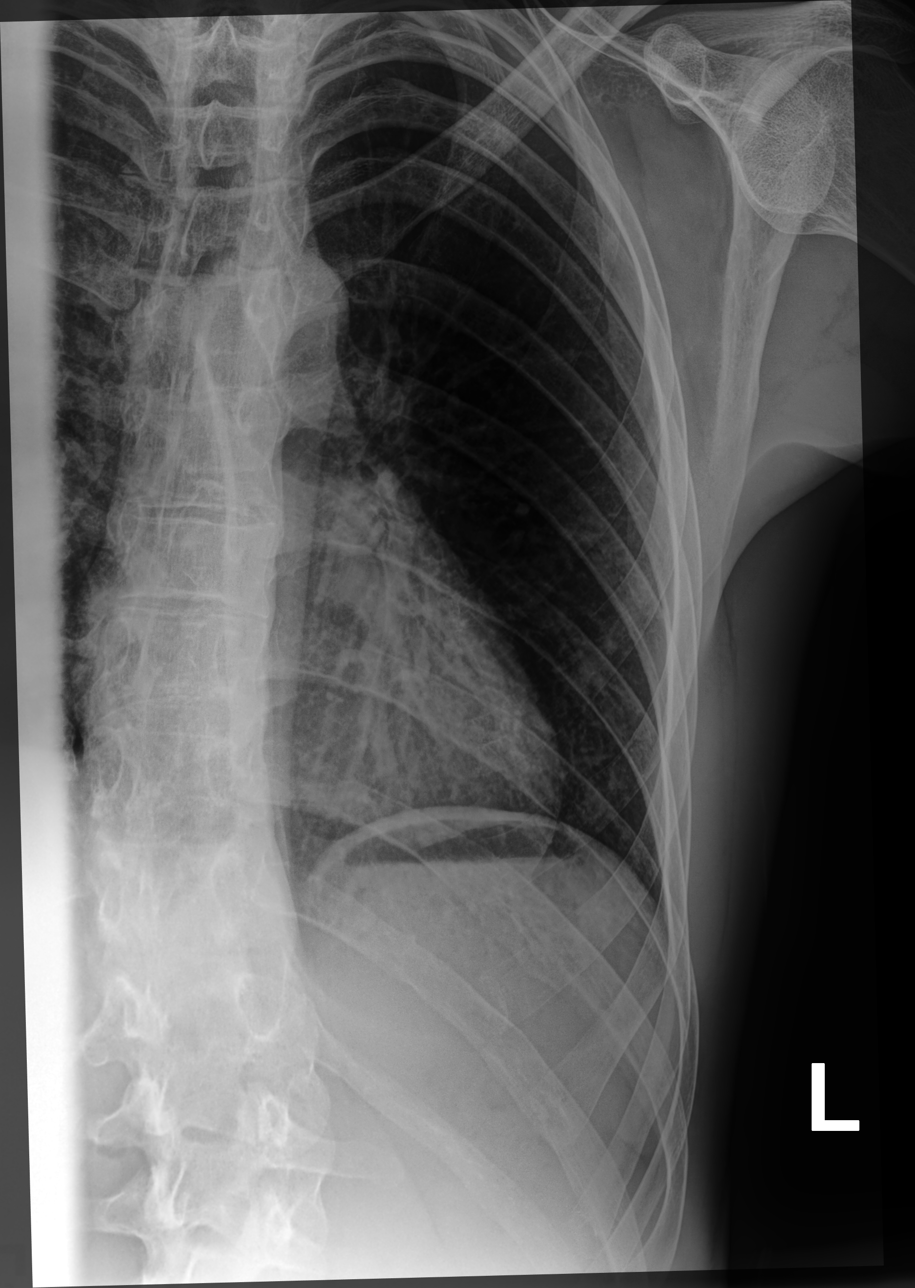

[hemithorax (ribs) pa (2 of 3)]
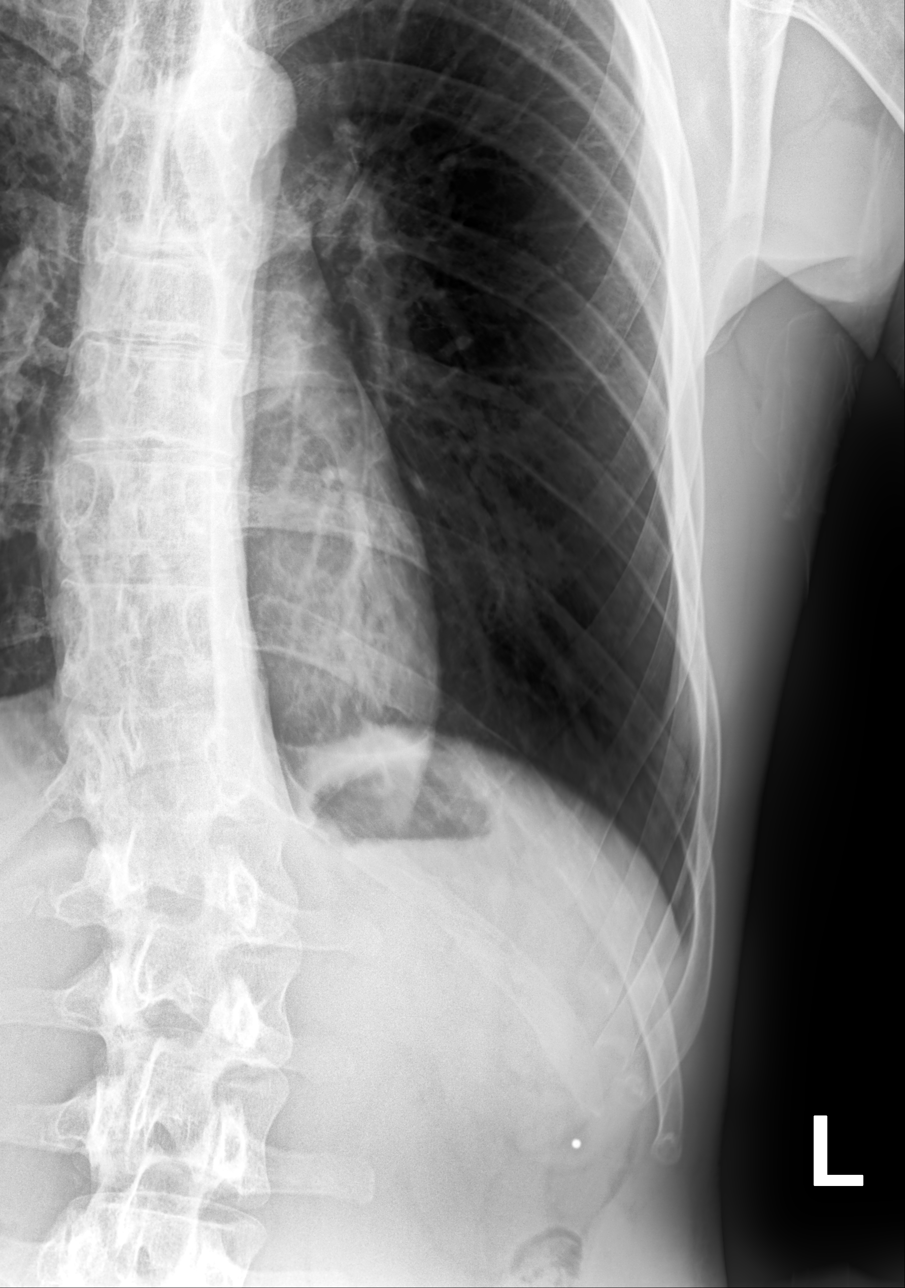

[hemithorax (ribs) pa (3 of 3)]
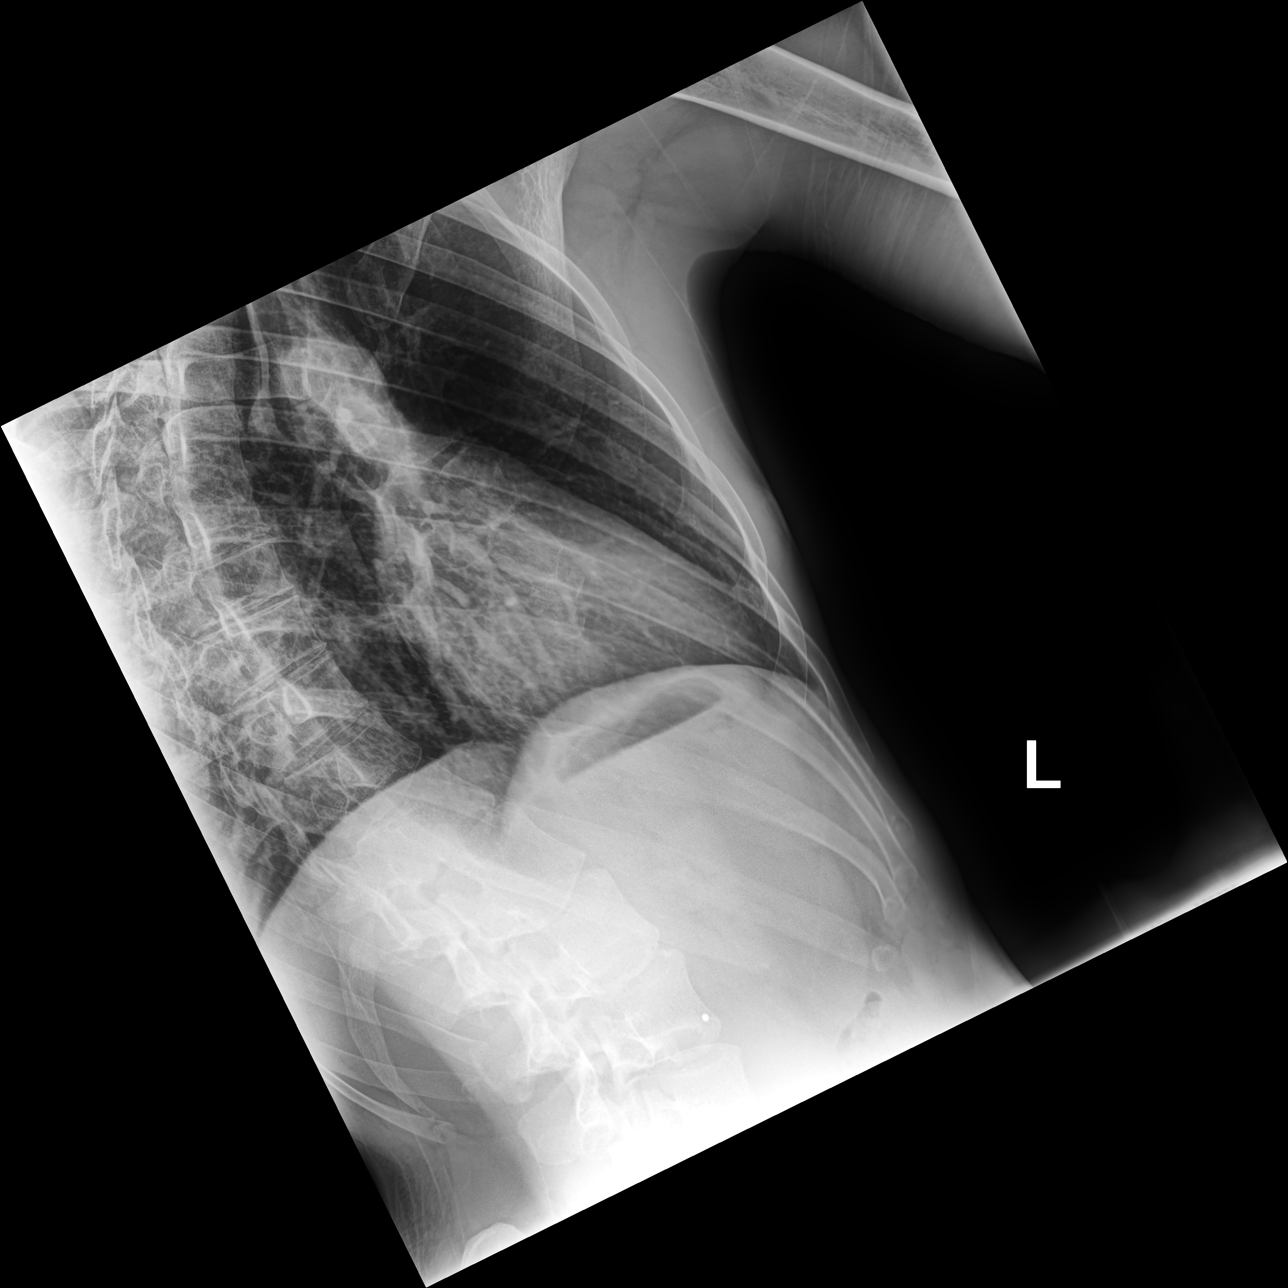

[4 of 4 positions shown; findings below may reference images not displayed]

FINDINGS: Heart and mediastinal shadows are normal. The lungs are clear. No
pneumothorax or hemothorax. Marker put in place at the location of
pain at the lower left ribs. No evidence of fracture. There is
chronic scoliosis of the spine.
IMPRESSION: No active cardiopulmonary disease. No rib fracture. Chronic
scoliosis of the spine.
